# Patient Record
Sex: Female | Born: 1945 | Race: White | Hispanic: No | Marital: Married | Smoking: Former smoker
Health system: Southern US, Community
[De-identification: ages and names within clinical notes are randomized; demographics above are authoritative.]

## PROBLEM LIST (undated history)

## (undated) DIAGNOSIS — F32A Depression, unspecified: Secondary | ICD-10-CM

## (undated) DIAGNOSIS — E049 Nontoxic goiter, unspecified: Secondary | ICD-10-CM

## (undated) DIAGNOSIS — R030 Elevated blood-pressure reading, without diagnosis of hypertension: Secondary | ICD-10-CM

## (undated) DIAGNOSIS — R053 Chronic cough: Secondary | ICD-10-CM

## (undated) DIAGNOSIS — F329 Major depressive disorder, single episode, unspecified: Secondary | ICD-10-CM

## (undated) DIAGNOSIS — I10 Essential (primary) hypertension: Secondary | ICD-10-CM

## (undated) DIAGNOSIS — F419 Anxiety disorder, unspecified: Secondary | ICD-10-CM

## (undated) DIAGNOSIS — R05 Cough: Secondary | ICD-10-CM

## (undated) DIAGNOSIS — Z780 Asymptomatic menopausal state: Secondary | ICD-10-CM

## (undated) DIAGNOSIS — M5431 Sciatica, right side: Secondary | ICD-10-CM

## (undated) DIAGNOSIS — E785 Hyperlipidemia, unspecified: Secondary | ICD-10-CM

## (undated) DIAGNOSIS — K219 Gastro-esophageal reflux disease without esophagitis: Secondary | ICD-10-CM

## (undated) DIAGNOSIS — R062 Wheezing: Secondary | ICD-10-CM

## (undated) HISTORY — DX: Asymptomatic menopausal state: Z78.0

## (undated) HISTORY — DX: Gastro-esophageal reflux disease without esophagitis: K21.9

## (undated) HISTORY — DX: Hyperlipidemia, unspecified: E78.5

## (undated) HISTORY — PX: KNEE SURGERY: SHX244

## (undated) HISTORY — DX: Essential (primary) hypertension: I10

## (undated) HISTORY — DX: Depression, unspecified: F32.A

## (undated) HISTORY — DX: Anxiety disorder, unspecified: F41.9

## (undated) HISTORY — PX: COLONOSCOPY: SHX174

## (undated) HISTORY — DX: Nontoxic goiter, unspecified: E04.9

## (undated) HISTORY — PX: TONSILLECTOMY: SUR1361

## (undated) HISTORY — PX: CATARACT EXTRACTION: SUR2

## (undated) HISTORY — DX: Major depressive disorder, single episode, unspecified: F32.9

## (undated) HISTORY — DX: Chronic cough: R05.3

## (undated) HISTORY — DX: Cough: R05

## (undated) HISTORY — DX: Elevated blood-pressure reading, without diagnosis of hypertension: R03.0

## (undated) HISTORY — DX: Wheezing: R06.2

## (undated) HISTORY — PX: BRONCHOSCOPY: SUR163

## (undated) HISTORY — DX: Sciatica, right side: M54.31

## (undated) HISTORY — PX: PARTIAL HYSTERECTOMY: SHX80

## (undated) HISTORY — PX: OTHER SURGICAL HISTORY: SHX169

## (undated) HISTORY — PX: POLYPECTOMY: SHX149

---

## 2001-07-17 ENCOUNTER — Encounter: Payer: Self-pay | Admitting: Allergy and Immunology

## 2001-07-17 ENCOUNTER — Encounter: Admission: RE | Admit: 2001-07-17 | Discharge: 2001-07-17 | Payer: Self-pay | Admitting: *Deleted

## 2003-03-18 ENCOUNTER — Encounter: Payer: Self-pay | Admitting: Internal Medicine

## 2003-03-18 ENCOUNTER — Encounter: Admission: RE | Admit: 2003-03-18 | Discharge: 2003-03-18 | Payer: Self-pay | Admitting: Internal Medicine

## 2004-08-14 ENCOUNTER — Ambulatory Visit: Payer: Self-pay | Admitting: Internal Medicine

## 2004-08-20 ENCOUNTER — Encounter: Admission: RE | Admit: 2004-08-20 | Discharge: 2004-08-20 | Payer: Self-pay | Admitting: Internal Medicine

## 2005-03-25 ENCOUNTER — Ambulatory Visit: Payer: Self-pay | Admitting: Internal Medicine

## 2005-04-02 ENCOUNTER — Ambulatory Visit: Payer: Self-pay

## 2005-04-04 ENCOUNTER — Ambulatory Visit: Payer: Self-pay | Admitting: Internal Medicine

## 2005-04-10 ENCOUNTER — Ambulatory Visit: Payer: Self-pay | Admitting: Internal Medicine

## 2005-04-30 ENCOUNTER — Ambulatory Visit: Payer: Self-pay | Admitting: Internal Medicine

## 2005-05-10 ENCOUNTER — Encounter: Admission: RE | Admit: 2005-05-10 | Discharge: 2005-08-08 | Payer: Self-pay | Admitting: Internal Medicine

## 2005-07-12 ENCOUNTER — Ambulatory Visit: Payer: Self-pay | Admitting: Internal Medicine

## 2005-09-06 ENCOUNTER — Ambulatory Visit: Payer: Self-pay | Admitting: Internal Medicine

## 2005-11-25 ENCOUNTER — Encounter: Admission: RE | Admit: 2005-11-25 | Discharge: 2005-11-25 | Payer: Self-pay | Admitting: Internal Medicine

## 2006-01-08 ENCOUNTER — Ambulatory Visit: Payer: Self-pay | Admitting: Internal Medicine

## 2006-02-18 ENCOUNTER — Ambulatory Visit: Payer: Self-pay | Admitting: Endocrinology

## 2006-02-18 ENCOUNTER — Encounter: Payer: Self-pay | Admitting: Internal Medicine

## 2006-02-24 ENCOUNTER — Ambulatory Visit (HOSPITAL_COMMUNITY): Admission: RE | Admit: 2006-02-24 | Discharge: 2006-02-24 | Payer: Self-pay | Admitting: Endocrinology

## 2006-02-24 HISTORY — PX: OTHER SURGICAL HISTORY: SHX169

## 2006-04-23 ENCOUNTER — Ambulatory Visit: Payer: Self-pay | Admitting: Internal Medicine

## 2006-07-28 ENCOUNTER — Ambulatory Visit: Payer: Self-pay | Admitting: Internal Medicine

## 2006-07-31 ENCOUNTER — Ambulatory Visit: Payer: Self-pay | Admitting: Internal Medicine

## 2006-11-27 ENCOUNTER — Encounter: Admission: RE | Admit: 2006-11-27 | Discharge: 2006-11-27 | Payer: Self-pay | Admitting: Internal Medicine

## 2007-01-12 ENCOUNTER — Ambulatory Visit: Payer: Self-pay | Admitting: Gastroenterology

## 2007-01-12 ENCOUNTER — Ambulatory Visit: Payer: Self-pay | Admitting: Internal Medicine

## 2007-01-12 DIAGNOSIS — F419 Anxiety disorder, unspecified: Secondary | ICD-10-CM

## 2007-01-12 DIAGNOSIS — J45909 Unspecified asthma, uncomplicated: Secondary | ICD-10-CM | POA: Insufficient documentation

## 2007-01-12 DIAGNOSIS — F32A Depression, unspecified: Secondary | ICD-10-CM | POA: Insufficient documentation

## 2007-01-12 DIAGNOSIS — K219 Gastro-esophageal reflux disease without esophagitis: Secondary | ICD-10-CM | POA: Insufficient documentation

## 2007-01-12 DIAGNOSIS — F329 Major depressive disorder, single episode, unspecified: Secondary | ICD-10-CM

## 2007-01-12 LAB — CONVERTED CEMR LAB
AST: 26 units/L (ref 0–37)
Calcium: 9.3 mg/dL (ref 8.4–10.5)
Eosinophils Absolute: 0.1 10*3/uL (ref 0.0–0.6)
Eosinophils Relative: 1.8 % (ref 0.0–5.0)
Free T4: 0.6 ng/dL (ref 0.6–1.6)
GFR calc Af Amer: 93 mL/min
GFR calc non Af Amer: 77 mL/min
Glucose, Bld: 107 mg/dL — ABNORMAL HIGH (ref 70–99)
HDL: 49.7 mg/dL (ref >39.0)
Hemoglobin: 13.9 g/dL (ref 12.0–15.0)
Lymphocytes Relative: 28.8 % (ref 12.0–46.0)
MCV: 91.5 fL (ref 78.0–100.0)
Microalb Creat Ratio: 8.9 mg/g (ref 0.0–30.0)
Monocytes Absolute: 0.4 10*3/uL (ref 0.2–0.7)
Monocytes Relative: 6 % (ref 3.0–11.0)
Neutro Abs: 3.9 10*3/uL (ref 1.4–7.7)
Platelets: 264 10*3/uL (ref 150–400)
Potassium: 3.9 meq/L (ref 3.5–5.1)
T3, Free: 3.2 pg/mL (ref 2.3–4.2)
TSH: 0.43 microintl units/mL (ref 0.35–5.50)
Triglycerides: 139 mg/dL (ref 0–149)
WBC: 6.2 10*3/uL (ref 4.5–10.5)

## 2007-01-19 ENCOUNTER — Ambulatory Visit: Payer: Self-pay | Admitting: Internal Medicine

## 2007-01-30 ENCOUNTER — Ambulatory Visit: Payer: Self-pay | Admitting: Gastroenterology

## 2007-01-30 ENCOUNTER — Encounter (INDEPENDENT_AMBULATORY_CARE_PROVIDER_SITE_OTHER): Payer: Self-pay | Admitting: Specialist

## 2007-01-30 LAB — HM COLONOSCOPY

## 2007-02-03 ENCOUNTER — Encounter: Admission: RE | Admit: 2007-02-03 | Discharge: 2007-02-03 | Payer: Self-pay | Admitting: General Surgery

## 2007-02-12 ENCOUNTER — Ambulatory Visit: Payer: Self-pay | Admitting: Internal Medicine

## 2007-04-13 ENCOUNTER — Telehealth: Payer: Self-pay | Admitting: Internal Medicine

## 2007-04-29 ENCOUNTER — Ambulatory Visit: Payer: Self-pay | Admitting: Internal Medicine

## 2007-05-07 ENCOUNTER — Encounter (INDEPENDENT_AMBULATORY_CARE_PROVIDER_SITE_OTHER): Payer: Self-pay | Admitting: *Deleted

## 2007-05-13 ENCOUNTER — Ambulatory Visit: Payer: Self-pay | Admitting: Internal Medicine

## 2007-05-29 ENCOUNTER — Ambulatory Visit: Payer: Self-pay | Admitting: Internal Medicine

## 2007-06-10 ENCOUNTER — Ambulatory Visit: Payer: Self-pay | Admitting: Internal Medicine

## 2007-07-11 ENCOUNTER — Encounter: Payer: Self-pay | Admitting: Endocrinology

## 2007-07-11 DIAGNOSIS — E042 Nontoxic multinodular goiter: Secondary | ICD-10-CM | POA: Insufficient documentation

## 2007-07-28 ENCOUNTER — Ambulatory Visit: Payer: Self-pay | Admitting: Internal Medicine

## 2007-08-05 ENCOUNTER — Telehealth (INDEPENDENT_AMBULATORY_CARE_PROVIDER_SITE_OTHER): Payer: Self-pay | Admitting: *Deleted

## 2007-08-19 ENCOUNTER — Ambulatory Visit: Payer: Self-pay | Admitting: Internal Medicine

## 2007-08-19 LAB — CONVERTED CEMR LAB: Glucose, Bld: 118 mg/dL

## 2007-08-21 ENCOUNTER — Ambulatory Visit: Payer: Self-pay | Admitting: Internal Medicine

## 2007-08-26 ENCOUNTER — Ambulatory Visit: Payer: Self-pay | Admitting: Family Medicine

## 2007-10-19 ENCOUNTER — Telehealth (INDEPENDENT_AMBULATORY_CARE_PROVIDER_SITE_OTHER): Payer: Self-pay | Admitting: *Deleted

## 2007-12-01 ENCOUNTER — Ambulatory Visit: Payer: Self-pay | Admitting: Internal Medicine

## 2007-12-16 ENCOUNTER — Encounter: Admission: RE | Admit: 2007-12-16 | Discharge: 2007-12-16 | Payer: Self-pay | Admitting: Internal Medicine

## 2008-01-21 ENCOUNTER — Telehealth (INDEPENDENT_AMBULATORY_CARE_PROVIDER_SITE_OTHER): Payer: Self-pay | Admitting: *Deleted

## 2008-02-19 ENCOUNTER — Ambulatory Visit: Payer: Self-pay | Admitting: Internal Medicine

## 2008-02-19 DIAGNOSIS — E785 Hyperlipidemia, unspecified: Secondary | ICD-10-CM | POA: Insufficient documentation

## 2008-02-22 ENCOUNTER — Encounter (INDEPENDENT_AMBULATORY_CARE_PROVIDER_SITE_OTHER): Payer: Self-pay | Admitting: *Deleted

## 2008-02-22 LAB — CONVERTED CEMR LAB
BUN: 14 mg/dL (ref 6–23)
Chloride: 103 meq/L (ref 96–112)
Cholesterol: 191 mg/dL (ref 0–200)
Direct LDL: 103.3 mg/dL
Eosinophils Absolute: 0.1 10*3/uL (ref 0.0–0.7)
Eosinophils Relative: 2.5 % (ref 0.0–5.0)
GFR calc non Af Amer: 77 mL/min
Glucose, Bld: 114 mg/dL — ABNORMAL HIGH (ref 70–99)
Hemoglobin: 14.9 g/dL (ref 12.0–15.0)
Hgb A1c MFr Bld: 6.7 % — ABNORMAL HIGH (ref 4.6–6.0)
Lymphocytes Relative: 32 % (ref 12.0–46.0)
Microalb Creat Ratio: 26.6 mg/g (ref 0.0–30.0)
Monocytes Relative: 7.8 % (ref 3.0–12.0)
Neutrophils Relative %: 56.6 % (ref 43.0–77.0)
Platelets: 241 10*3/uL (ref 150–400)
Potassium: 4.3 meq/L (ref 3.5–5.1)
RBC: 4.7 M/uL (ref 3.87–5.11)

## 2008-02-24 ENCOUNTER — Encounter (INDEPENDENT_AMBULATORY_CARE_PROVIDER_SITE_OTHER): Payer: Self-pay | Admitting: *Deleted

## 2008-03-08 ENCOUNTER — Encounter: Payer: Self-pay | Admitting: Internal Medicine

## 2008-04-14 ENCOUNTER — Telehealth (INDEPENDENT_AMBULATORY_CARE_PROVIDER_SITE_OTHER): Payer: Self-pay | Admitting: *Deleted

## 2008-06-27 ENCOUNTER — Ambulatory Visit: Payer: Self-pay | Admitting: Internal Medicine

## 2008-07-01 ENCOUNTER — Telehealth: Payer: Self-pay | Admitting: Internal Medicine

## 2008-07-07 ENCOUNTER — Telehealth: Payer: Self-pay | Admitting: Internal Medicine

## 2008-07-08 ENCOUNTER — Ambulatory Visit: Payer: Self-pay | Admitting: Internal Medicine

## 2008-09-12 ENCOUNTER — Telehealth: Payer: Self-pay | Admitting: Internal Medicine

## 2008-11-16 ENCOUNTER — Telehealth (INDEPENDENT_AMBULATORY_CARE_PROVIDER_SITE_OTHER): Payer: Self-pay | Admitting: *Deleted

## 2008-11-30 ENCOUNTER — Encounter: Payer: Self-pay | Admitting: Internal Medicine

## 2008-12-15 ENCOUNTER — Encounter (INDEPENDENT_AMBULATORY_CARE_PROVIDER_SITE_OTHER): Payer: Self-pay | Admitting: *Deleted

## 2009-01-12 ENCOUNTER — Encounter: Admission: RE | Admit: 2009-01-12 | Discharge: 2009-01-12 | Payer: Self-pay | Admitting: Internal Medicine

## 2009-01-12 LAB — HM MAMMOGRAPHY: HM Mammogram: NEGATIVE

## 2009-01-16 ENCOUNTER — Encounter (INDEPENDENT_AMBULATORY_CARE_PROVIDER_SITE_OTHER): Payer: Self-pay | Admitting: *Deleted

## 2009-02-14 ENCOUNTER — Ambulatory Visit: Payer: Self-pay | Admitting: Internal Medicine

## 2009-02-14 DIAGNOSIS — G4700 Insomnia, unspecified: Secondary | ICD-10-CM | POA: Insufficient documentation

## 2009-02-15 ENCOUNTER — Ambulatory Visit: Payer: Self-pay | Admitting: Internal Medicine

## 2009-02-21 LAB — CONVERTED CEMR LAB
Basophils Relative: 0.2 % (ref 0.0–3.0)
Chloride: 108 meq/L (ref 96–112)
Cholesterol: 140 mg/dL (ref 0–200)
Eosinophils Absolute: 0.1 10*3/uL (ref 0.0–0.7)
LDL Cholesterol: 71 mg/dL (ref 0–99)
MCHC: 33.4 g/dL (ref 30.0–36.0)
MCV: 93.3 fL (ref 78.0–100.0)
Monocytes Absolute: 0.3 10*3/uL (ref 0.1–1.0)
Neutro Abs: 3.3 10*3/uL (ref 1.4–7.7)
Neutrophils Relative %: 63.6 % (ref 43.0–77.0)
Potassium: 4.7 meq/L (ref 3.5–5.1)
RBC: 4.29 M/uL (ref 3.87–5.11)
Sodium: 145 meq/L (ref 135–145)
TSH: 0.32 microintl units/mL — ABNORMAL LOW (ref 0.35–5.50)
Total CHOL/HDL Ratio: 3
Vit D, 25-Hydroxy: 14 ng/mL — ABNORMAL LOW (ref 30–89)

## 2009-02-22 ENCOUNTER — Ambulatory Visit: Payer: Self-pay | Admitting: Internal Medicine

## 2009-02-28 ENCOUNTER — Encounter: Payer: Self-pay | Admitting: Internal Medicine

## 2009-02-28 ENCOUNTER — Encounter: Admission: RE | Admit: 2009-02-28 | Discharge: 2009-02-28 | Payer: Self-pay | Admitting: Internal Medicine

## 2009-03-01 ENCOUNTER — Telehealth (INDEPENDENT_AMBULATORY_CARE_PROVIDER_SITE_OTHER): Payer: Self-pay | Admitting: *Deleted

## 2009-03-01 LAB — CONVERTED CEMR LAB
Free T4: 0.6 ng/dL (ref 0.6–1.6)
T3, Free: 2.9 pg/mL (ref 2.3–4.2)

## 2009-03-06 ENCOUNTER — Telehealth (INDEPENDENT_AMBULATORY_CARE_PROVIDER_SITE_OTHER): Payer: Self-pay | Admitting: *Deleted

## 2009-03-31 ENCOUNTER — Telehealth (INDEPENDENT_AMBULATORY_CARE_PROVIDER_SITE_OTHER): Payer: Self-pay | Admitting: *Deleted

## 2009-04-24 IMAGING — CR DG CHEST 2V
2 series · 2 of 2 positions shown · non-contrast
Comparison: Chest of 10/27/90.

CLINICAL DATA: Cough. 
 CHEST ? 2 VIEW:

[view not recorded (1 of 2)]
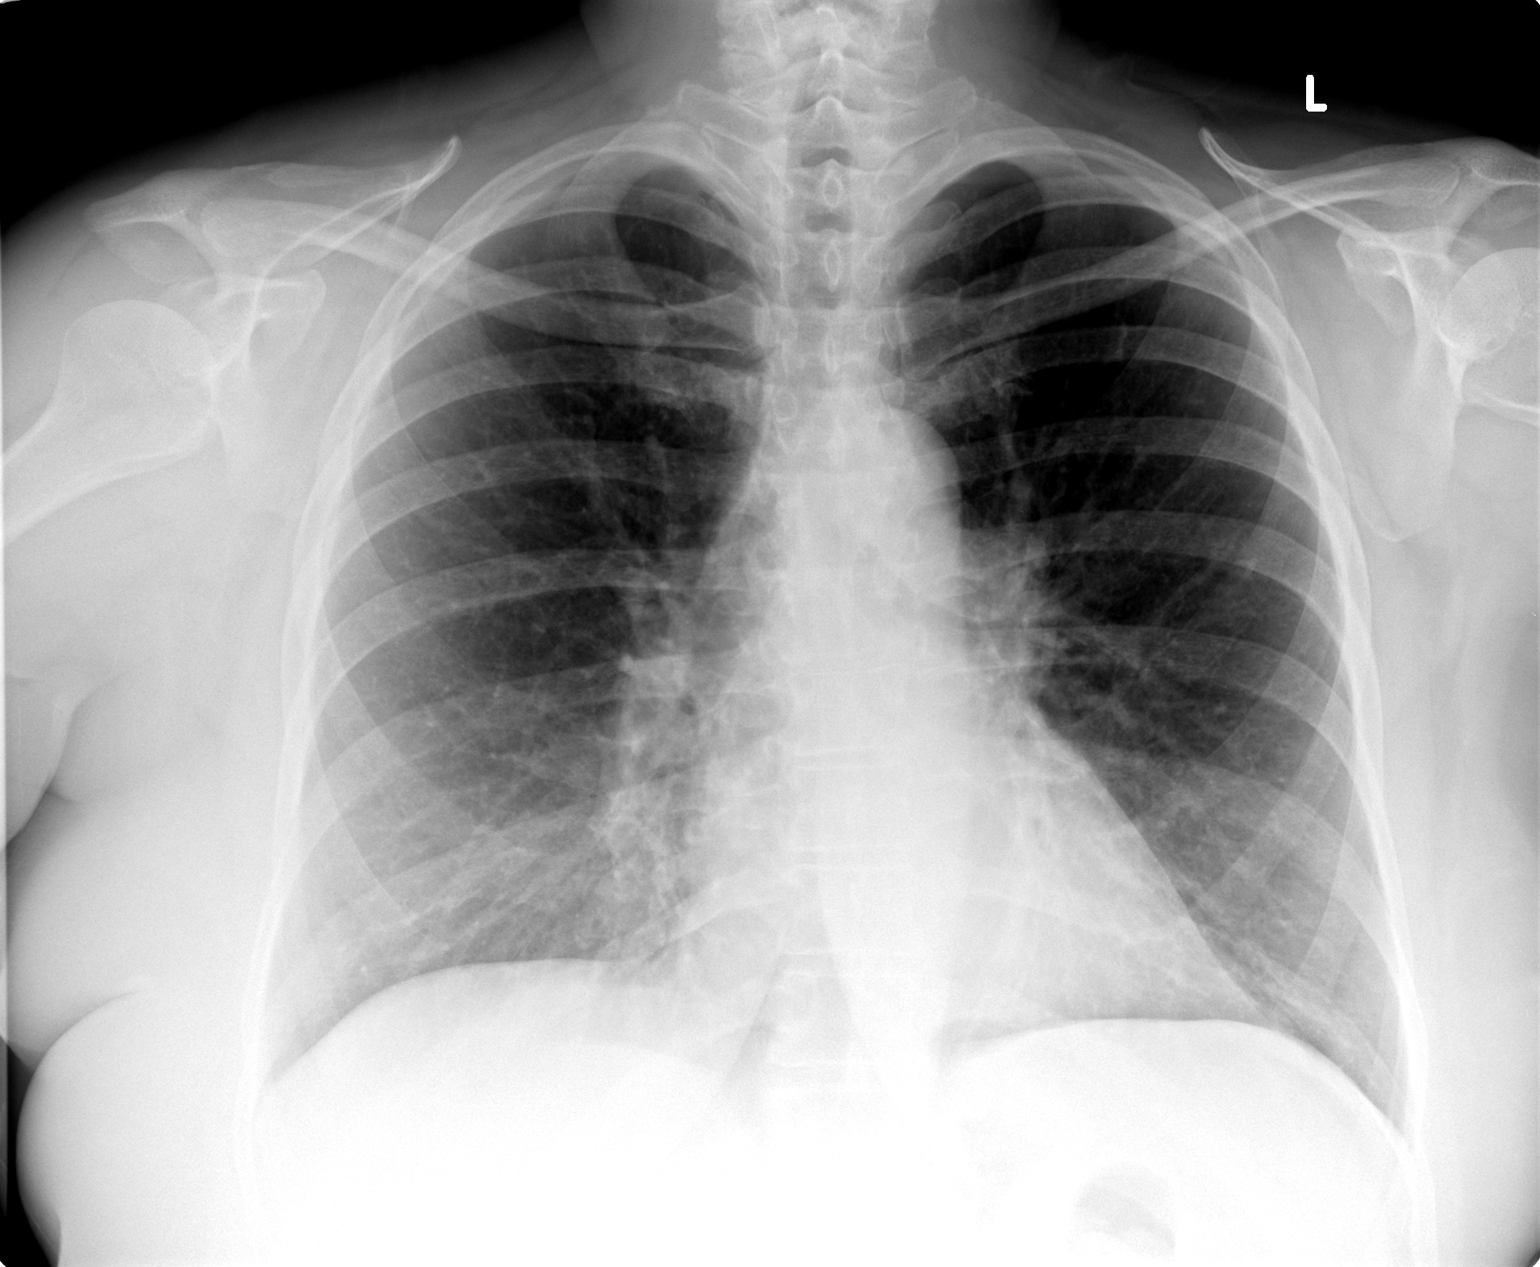

[view not recorded (2 of 2)]
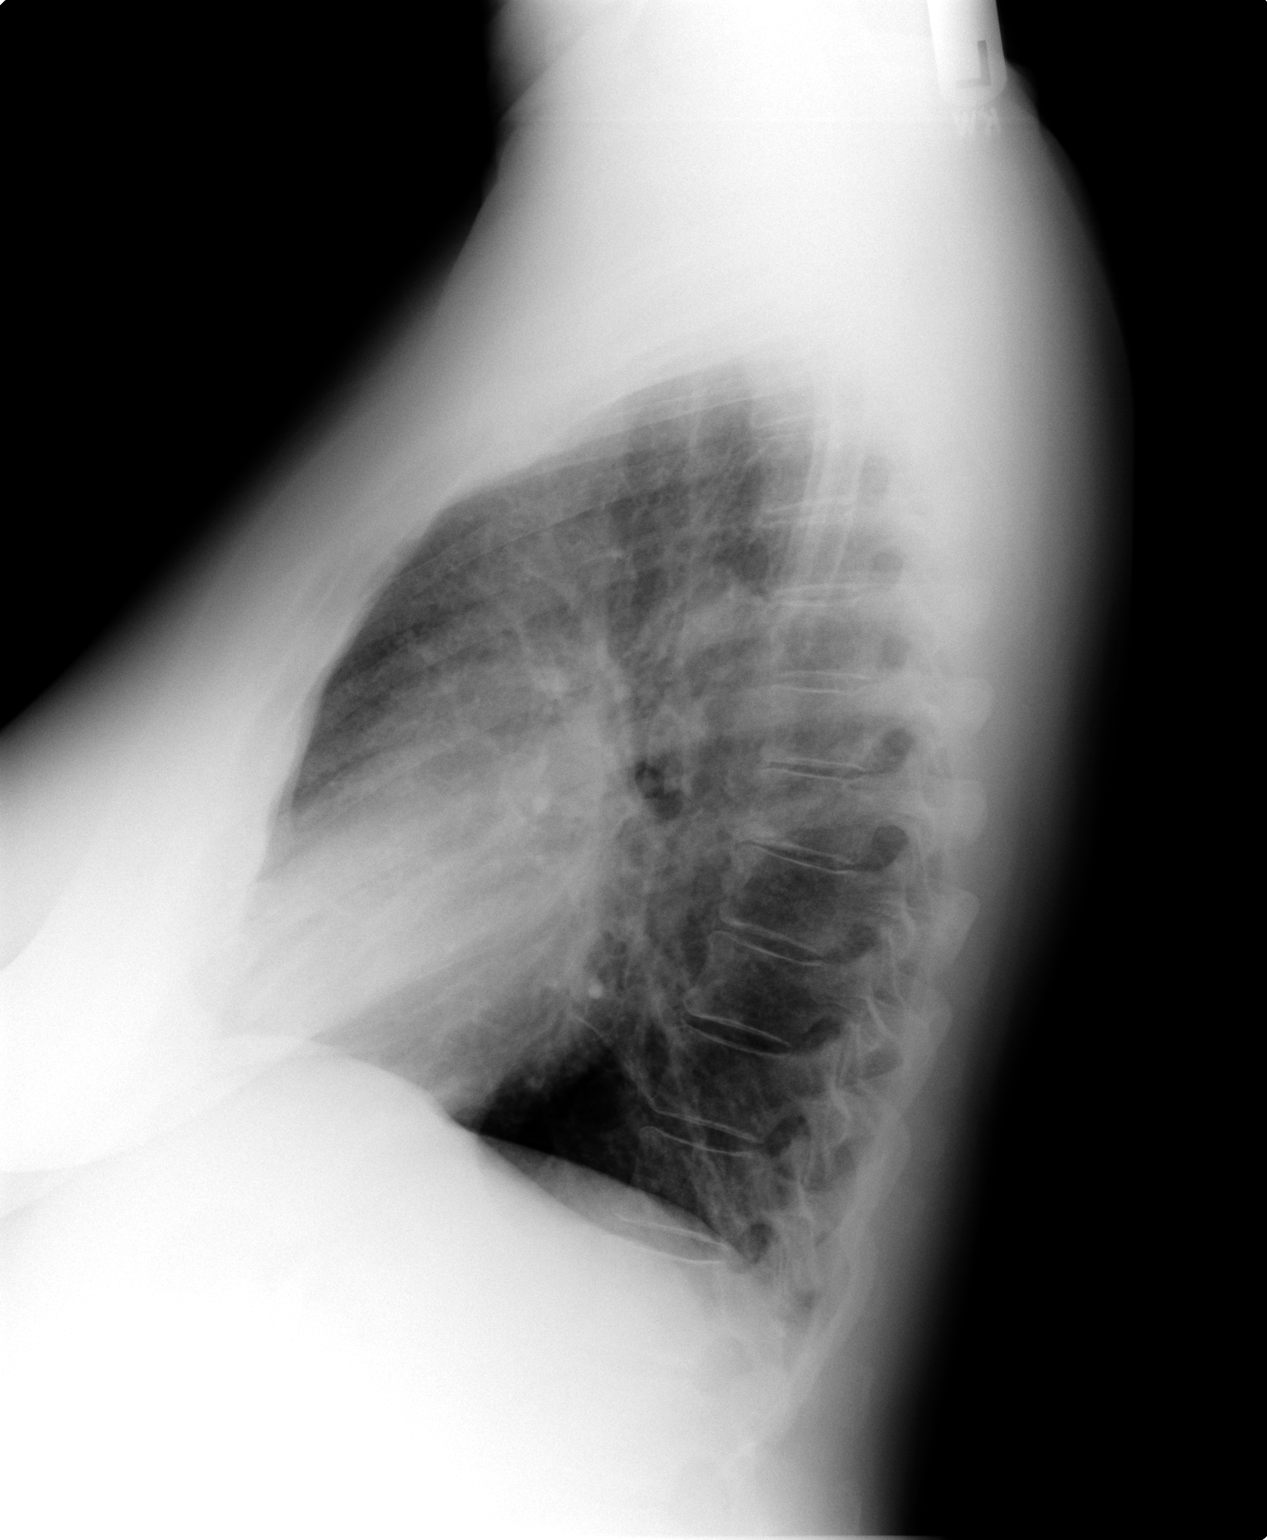

[2 of 2 positions shown; findings below may reference images not displayed]

FINDINGS: Two views of the chest show no active infiltrate or effusion.  Mild peribronchial thickening is noted.  The heart is within normal limits in size.
IMPRESSION: 1. No active lung disease. 
 2. Mild peribronchial thickening.

## 2009-05-18 ENCOUNTER — Telehealth: Payer: Self-pay | Admitting: Internal Medicine

## 2009-05-18 ENCOUNTER — Telehealth (INDEPENDENT_AMBULATORY_CARE_PROVIDER_SITE_OTHER): Payer: Self-pay | Admitting: *Deleted

## 2009-05-22 ENCOUNTER — Telehealth (INDEPENDENT_AMBULATORY_CARE_PROVIDER_SITE_OTHER): Payer: Self-pay | Admitting: *Deleted

## 2009-05-22 ENCOUNTER — Encounter: Payer: Self-pay | Admitting: Internal Medicine

## 2009-07-11 ENCOUNTER — Encounter: Payer: Self-pay | Admitting: Internal Medicine

## 2009-07-27 ENCOUNTER — Ambulatory Visit: Payer: Self-pay | Admitting: Internal Medicine

## 2009-07-28 ENCOUNTER — Telehealth: Payer: Self-pay | Admitting: Internal Medicine

## 2009-09-08 ENCOUNTER — Encounter (INDEPENDENT_AMBULATORY_CARE_PROVIDER_SITE_OTHER): Payer: Self-pay | Admitting: *Deleted

## 2009-09-14 ENCOUNTER — Ambulatory Visit: Payer: Self-pay | Admitting: Internal Medicine

## 2009-09-14 ENCOUNTER — Observation Stay (HOSPITAL_COMMUNITY): Admission: AD | Admit: 2009-09-14 | Discharge: 2009-09-15 | Payer: Self-pay | Admitting: Geriatric Medicine

## 2009-09-14 DIAGNOSIS — J479 Bronchiectasis, uncomplicated: Secondary | ICD-10-CM | POA: Insufficient documentation

## 2009-09-15 ENCOUNTER — Ambulatory Visit: Payer: Self-pay | Admitting: Vascular Surgery

## 2009-09-15 ENCOUNTER — Encounter: Payer: Self-pay | Admitting: Internal Medicine

## 2009-09-21 ENCOUNTER — Telehealth (INDEPENDENT_AMBULATORY_CARE_PROVIDER_SITE_OTHER): Payer: Self-pay | Admitting: *Deleted

## 2009-10-18 ENCOUNTER — Ambulatory Visit: Payer: Self-pay | Admitting: Internal Medicine

## 2009-10-18 LAB — CONVERTED CEMR LAB: TSH: 0.26 microintl units/mL — ABNORMAL LOW (ref 0.35–5.50)

## 2009-11-06 ENCOUNTER — Telehealth: Payer: Self-pay | Admitting: Internal Medicine

## 2009-11-07 ENCOUNTER — Telehealth (INDEPENDENT_AMBULATORY_CARE_PROVIDER_SITE_OTHER): Payer: Self-pay | Admitting: *Deleted

## 2010-01-08 ENCOUNTER — Encounter (INDEPENDENT_AMBULATORY_CARE_PROVIDER_SITE_OTHER): Payer: Self-pay | Admitting: *Deleted

## 2010-02-06 ENCOUNTER — Ambulatory Visit: Payer: Self-pay | Admitting: Internal Medicine

## 2010-02-06 DIAGNOSIS — E119 Type 2 diabetes mellitus without complications: Secondary | ICD-10-CM | POA: Insufficient documentation

## 2010-02-06 DIAGNOSIS — M949 Disorder of cartilage, unspecified: Secondary | ICD-10-CM

## 2010-02-06 DIAGNOSIS — M899 Disorder of bone, unspecified: Secondary | ICD-10-CM | POA: Insufficient documentation

## 2010-02-06 DIAGNOSIS — E079 Disorder of thyroid, unspecified: Secondary | ICD-10-CM | POA: Insufficient documentation

## 2010-02-07 ENCOUNTER — Telehealth: Payer: Self-pay | Admitting: Internal Medicine

## 2010-02-08 ENCOUNTER — Ambulatory Visit: Payer: Self-pay | Admitting: Internal Medicine

## 2010-02-08 DIAGNOSIS — E0590 Thyrotoxicosis, unspecified without thyrotoxic crisis or storm: Secondary | ICD-10-CM | POA: Insufficient documentation

## 2010-02-09 ENCOUNTER — Ambulatory Visit: Payer: Self-pay | Admitting: Internal Medicine

## 2010-02-09 LAB — CONVERTED CEMR LAB
Basophils Absolute: 0 10*3/uL (ref 0.0–0.1)
Calcium: 9.3 mg/dL (ref 8.4–10.5)
Creatinine, Ser: 0.7 mg/dL (ref 0.4–1.2)
Creatinine,U: 85.7 mg/dL
Eosinophils Relative: 0 % (ref 0.0–5.0)
Free T4: 0.7 ng/dL (ref 0.6–1.6)
GFR calc non Af Amer: 83.57 mL/min (ref >60)
Glucose, Bld: 99 mg/dL (ref 70–99)
HCT: 38.1 % (ref 36.0–46.0)
Hgb A1c MFr Bld: 6.6 % — ABNORMAL HIGH (ref 4.6–6.5)
Lymphs Abs: 1.6 10*3/uL (ref 0.7–4.0)
MCV: 93.3 fL (ref 78.0–100.0)
Monocytes Absolute: 0.4 10*3/uL (ref 0.1–1.0)
Neutro Abs: 5.5 10*3/uL (ref 1.4–7.7)
Platelets: 264 10*3/uL (ref 150.0–400.0)
RDW: 14.9 % — ABNORMAL HIGH (ref 11.5–14.6)
Sodium: 144 meq/L (ref 135–145)
T3, Free: 2.2 pg/mL — ABNORMAL LOW (ref 2.3–4.2)
Total CHOL/HDL Ratio: 2
VLDL: 16.2 mg/dL (ref 0.0–40.0)

## 2010-02-12 ENCOUNTER — Ambulatory Visit: Payer: Self-pay | Admitting: Internal Medicine

## 2010-02-13 ENCOUNTER — Telehealth: Payer: Self-pay | Admitting: Internal Medicine

## 2010-04-27 ENCOUNTER — Ambulatory Visit: Payer: Self-pay | Admitting: Family Medicine

## 2010-06-26 ENCOUNTER — Encounter: Payer: Self-pay | Admitting: Internal Medicine

## 2010-07-30 ENCOUNTER — Telehealth: Payer: Self-pay | Admitting: Internal Medicine

## 2010-08-24 ENCOUNTER — Ambulatory Visit: Payer: Self-pay | Admitting: Internal Medicine

## 2010-08-24 DIAGNOSIS — M6740 Ganglion, unspecified site: Secondary | ICD-10-CM | POA: Insufficient documentation

## 2010-08-24 DIAGNOSIS — I10 Essential (primary) hypertension: Secondary | ICD-10-CM | POA: Insufficient documentation

## 2010-08-29 LAB — CONVERTED CEMR LAB
BUN: 18 mg/dL (ref 6–23)
CO2: 28 meq/L (ref 19–32)
Chloride: 103 meq/L (ref 96–112)
Glucose, Bld: 113 mg/dL — ABNORMAL HIGH (ref 70–99)
Potassium: 4.7 meq/L (ref 3.5–5.3)
Sodium: 142 meq/L (ref 135–145)
TSH: 0.728 microintl units/mL (ref 0.350–4.500)

## 2010-09-03 ENCOUNTER — Telehealth: Payer: Self-pay | Admitting: Internal Medicine

## 2010-10-23 ENCOUNTER — Encounter: Payer: Self-pay | Admitting: Internal Medicine

## 2010-10-25 NOTE — Progress Notes (Signed)
Summary: colon due 01/2012  ---- Converted from flag ---- ---- 02/06/2010 6:37 PM, Raheen Capili E. Lash Matulich MD wrote: contact GI ([facility]), next Cscope??) ------------------------------ due 01/2012 .Shary Decamp  Feb 07, 2010 8:27 AM

## 2010-10-25 NOTE — Letter (Signed)
Summary: Primary Care Appointment Letter  [facility]  [address] [place], [place] [address]   Phone: [phone]  Fax: [phone]    01/08/2010 MRN: [id number]  [facility] [address] [place], [place]  [address]  Dear [facility],   Your Primary Care Physician Jose E. Paz MD has indicated that:    ____X___it is time to schedule an appointment.  Please call our office @ [phone] to schedule a complete physical exam with Dr. Drue Novel.  Thank you,    [facility] Scheduler

## 2010-10-25 NOTE — Letter (Signed)
Summary: [facility]  [facility]   Imported By: Lanelle Bal 07/26/2010 15:36:07  _____________________________________________________________________  External Attachment:    Type:   Image     Comment:   External Document

## 2010-10-25 NOTE — Progress Notes (Signed)
Summary: right leg pain, referral to ortho  Phone Note Call from Patient   Caller: Patient Summary of Call: Still with right leg pain from falling 08/2009.  referral to ortho Shary Decamp  November 07, 2009 4:18 PM   New Problems: LEG PAIN, RIGHT (ICD-729.5)   New Problems: LEG PAIN, RIGHT (ICD-729.5)

## 2010-10-25 NOTE — Progress Notes (Signed)
Summary: laboratory results  Phone Note Outgoing Call   Summary of Call: advised patient -repeated potassium was normal -vitamin D continue to be low, ergocalciferol 50,000 units weekly for 3 months again.  -diabetes well controlled -cholesterol very well controlled, continue with the same medication -TSH continue to be slightly suppressed, recheck in 6 months Jose E. Paz MD  Feb 13, 2010 5:31 PM     New/Updated Medications: ERGOCALCIFEROL 50000 UNIT CAPS (ERGOCALCIFEROL) 1 by mouth every week x 12 weeks Prescriptions: ERGOCALCIFEROL 50000 UNIT CAPS (ERGOCALCIFEROL) 1 by mouth every week x 12 weeks  #12 x 0   Entered by:   Shary Decamp   Authorized by:   Nolon Rod. Paz MD   Signed by:   Shary Decamp on 02/14/2010   Method used:   Electronically to        [facility] [id number] (retail)       [address]       [place], [place]  [address]       Ph: [phone] or [phone]       Fax: [phone]   RxID:   [id number]

## 2010-10-25 NOTE — Progress Notes (Signed)
Summary: refill  Phone Note Refill Request Message from:  Fax from Pharmacy on July 30, 2010 11:46 AM  Refills Requested: Medication #1:  AMBIEN CR 12.5 MG CR-TABS 1 by mouth at bedtime [BMN] [facility] [phone] - 90 DAY SUPPLY  Initial call taken by: Okey Regal [place],  July 30, 2010 11:51 AM  Follow-up for Phone Call        last filled 02/06/10 Follow-up by: Army Fossa CMA,  July 30, 2010 11:52 AM  Additional Follow-up for Phone Call Additional follow up Details #1::        ok #90 and 1 RF Jose E. Paz MD  July 30, 2010 1:41 PM     Prescriptions: AMBIEN CR 12.5 MG CR-TABS (ZOLPIDEM TARTRATE) 1 by mouth at bedtime Brand medically necessary #90 x 1   Entered by:   Army Fossa CMA   Authorized by:   Nolon Rod. Paz MD   Signed by:   Army Fossa CMA on 07/30/2010   Method used:   Printed then faxed to ...       [facility] (mail-order)             , [place]         Ph:        Fax: [phone]   RxID:   [id number]

## 2010-10-25 NOTE — Assessment & Plan Note (Signed)
Summary: HAVING TROUBLE WITH FEET/FLU SHOT/KN   Vital Signs:  Patient profile:   65 year old female Weight:      210.50 pounds Pulse rate:   87 / minute Pulse rhythm:   regular BP sitting:   148 / 60  (left arm) Cuff size:   regular  Vitals Entered By: Army Fossa CMA (August 24, 2010 3:43 PM) CC: Pt here c/o pain in both feet. Comments Hurt to walk. Swollen x Several months [facility] flu shot    History of Present Illness: here today because she has pain and a lump in the dorsal aspect of her feet. L>R the lump has been there for a while      Current Medications (verified): 1)  Flonase 50 Mcg/act Susp (Fluticasone Propionate) .... Spray 1 Spray Into Both Nostrils Twice A Day 2)  Ventolin Hfa 108 (90 Base) Mcg/act  Aers (Albuterol Sulfate) .... 2 Puff Every 4 Hours As Needed 3)  Singulair 10 Mg Tabs (Montelukast Sodium) .... Take 1 Tablet By Mouth At Bedtime 4)  Tussionex Pennkinetic Er 8-10 Mg/80ml Lqcr (Chlorpheniramine-Hydrocodone) .... Take 1 Teaspoon By Mouth Twice A Day 5)  Omeprazole 20 Mg Cpdr (Omeprazole) .... Bid 6)  Lipitor 80 Mg  Tabs (Atorvastatin Calcium) .Marland Kitchen.. 1 By Mouth Once Daily 7)  Verapamil Hcl Cr 120 Mg Tbcr (Verapamil Hcl) .... Take 1 Tablet By Mouth Once A Day 8)  Citalopram Hydrobromide 20 Mg Tabs (Citalopram Hydrobromide) .... Two Times A Day - Due Office Visit For Additional Refills 9)  Ambien Cr 12.5 Mg Cr-Tabs (Zolpidem Tartrate) .Marland Kitchen.. 1 By Mouth At Bedtime  Allergies (verified): No Known Drug Allergies  Past History:  Past Medical History: G0 P0  menopause Increased BP w/cardiolite DIABETES MELLITUS, TYPE II, BORDERLINE  DEPRESSION  ANXIETY   GERD   GOITER, MULTINODULAR   Hyperlipidemia ASTHMA (h/o IgG deficiency, bronchiectasis), sees Dr Dallie Dad , previously saw  Dr Sherene Sires   Hypertension  Past Surgical History: Reviewed history from 02/06/2010 and no changes required. Thyroid Ultrasound (02/24/2006) Hysterectomy,  partial Tonsillectomy had a Bronchoscopy in the past  eye  Lid surgery (cosmetic)  Social History: Reviewed history from 02/06/2010 and no changes required. moved from [place] aprox 2003  Married 2 kids -- adopted  Retired tobacco-- quit at age 59 ETOH-- rarely  Regular exercise--no  Review of Systems       good medication with her BP medicine amb. BP usually around 150 , at her allergies her systolic blood pressure was confirmed to be 150 Trace bilateral pretibial edema from time to time    Physical Exam  General:  alert and well-developed.   Mouth:  no gingival abnormalities.   Lungs:  normal respiratory effort and no intercostal retractions.  a few rhonchi and  wheezing bilaterally, no respiratory distress Heart:  normal rate, regular rhythm, and no murmur.   Pulses:  good pedal pulses bilaterally Extremities:  trace bilateral pretibial edema. At the dorsum of the feet, L>R , has a soft, nontender, no red mass : approximately 1.5 cm consistent with ganglion cysts Psych:  not anxious appearing and not depressed appearing.     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 Needs better control, currently on  verapamil   Recently, her potassium was slightly elevated (thus avoid ARBs-ACEi) for now we'll increase verapamil dose from 120 to ----> 180   Her updated medication list for this problem includes:    Verapamil Hcl Cr 180 Mg Cr-tabs (Verapamil hcl) .Marland Kitchen... 1 by mouth once daily  BP  today: 148/60 Prior BP: 130/70 (04/27/2010)  Labs Reviewed: K+: 4.3 (02/12/2010) Creat: : 0.7 (02/08/2010)   Chol: 144 (02/08/2010)   HDL: 61.40 (02/08/2010)   LDL: 66 (02/08/2010)   TG: 81.0 (02/08/2010)  Complete Medication List: 1)  Flonase 50 Mcg/act Susp (Fluticasone propionate) .... Spray 1 spray into both nostrils twice a day 2)  Ventolin Hfa 108 (90 Base) Mcg/act Aers (Albuterol sulfate) .... 2 puff every 4 hours as needed 3)  Singulair 10 Mg Tabs (Montelukast sodium) .... Take 1 tablet by mouth at bedtime 4)  Tussionex Pennkinetic Er 8-10 Mg/65ml Lqcr (Chlorpheniramine-hydrocodone) .... Take 1 teaspoon by mouth twice a day 5)  Omeprazole 20 Mg Cpdr (Omeprazole) .... Bid 6)  Lipitor 80 Mg Tabs (Atorvastatin calcium) .Marland Kitchen.. 1 by mouth once daily 7)  Verapamil Hcl Cr 180 Mg Cr-tabs (Verapamil hcl) .Marland Kitchen.. 1 by mouth once daily 8)  Citalopram Hydrobromide 20 Mg Tabs (Citalopram hydrobromide) .... Two times a day 9)  Ambien Cr 12.5 Mg Cr-tabs (Zolpidem tartrate) .Marland Kitchen.. 1 by mouth at bedtime  Other Orders: Admin 1st Vaccine (60454) Flu Vaccine 20yrs + (09811)  Patient Instructions: 1)  increase verapamil to 180 mg daily 2)  Check your blood pressure 2 or 3 times a week. If it is more than 140/85 consistently,please let us know  3)  Please schedule a follow-up appointment in 4 months .  Prescriptions: VERAPAMIL HCL CR 180 MG CR-TABS (VERAPAMIL HCL) 1 by mouth once daily  #90 x 1   Entered and Authorized by:   Elita Quick E. Lehua Flores MD   Signed by:   Nolon Rod. Kourtnee Lahey MD on 08/24/2010   Method used:   Print then Give to Patient   RxID:   [id number]    Orders Added: 1)  Admin 1st Vaccine [90471] 2)  Flu Vaccine 29yrs + [78469] 3)  Venipuncture [62952] 4)  Est. Patient Level III [84132] Flu Vaccine Consent Questions     Do you have a history of severe allergic reactions to this vaccine? no    Any prior history of allergic reactions to egg and/or gelatin? no    Do you have a sensitivity to the  preservative Thimersol? no    Do you have a past history of Guillan-Barre Syndrome? no    Do you currently have an acute febrile illness? no    Have you ever had a severe reaction to latex? no    Vaccine information given and explained to patient? yes    Are you currently pregnant? no    Lot Number:[id number]   Exp Date:03/23/2011   Site Given  Left Deltoid IMaccine 61yrs + [id number]     .[email or web address]

## 2010-10-25 NOTE — Progress Notes (Signed)
  Phone Note Outgoing Call   Summary of Call: patient has a history of low  TSH on-and-off for years she was evaluated by endocrinology 5 -- 29 -- 07 they recommend a  ultrasound, if the ultrasound show multinodal goiter then the next step would be a nuclear medicine scan ultrasound was  done in June 2007 showed: " Unremarkable thyroid ultrasound.  Tiny benign cyst in the mid portion  of the right lobe measures 1.5 mm in greatest diameter.  No other  nodules are detected" plan:  assess for  symptoms of hyperthyroidism  on return to the office, continue monitoring TFTs, consider rereferral to endocrinology please let patient know Jose E. Paz MD  November 06, 2009 11:08 AM    Follow-up for Phone Call        discussed with pt Follow-up by: Shary Decamp,  November 07, 2009 4:15 PM

## 2010-10-25 NOTE — Assessment & Plan Note (Signed)
Summary: CPX--PH   Vital Signs:  Patient profile:   65 year old female Height:      65 inches Weight:      212 pounds BMI:     35.41 Pulse rate:   64 / minute BP sitting:   138 / 80  Vitals Entered By: Shary Decamp (Feb 06, 2010 2:06 PM) CC: yearly, not fasting   History of Present Illness: insomnia-- states is geting the generic Remus Loffler although she was Rx the [employer]. needs a rx for branded ambien  DIABETES-- no ambulatory CBGs , needs a glucometer  occasionally check ambulatory BPs , normal BPs lately   DEPRESSION- well controlled ANXIETY -- occasionally anxiety at night  ?  Hyperlipidemia-- good medication compliance   ASTHMA-- just saw  Dr Dallie Dad   yearly check up, chart reviewed   Preventive Screening-Counseling & Management  Caffeine-Diet-Exercise     Caffeine use/day: 4      Drug Use:  no.    Allergies: No Known Drug Allergies  Past History:  Past Medical History: G0 P0  menopause Increased BP w/cardiolite DIABETES MELLITUS, TYPE II, BORDERLINE  DEPRESSION  ANXIETY   GERD   GOITER, MULTINODULAR   Hyperlipidemia ASTHMA (h/o IgG deficiency, bronchiectasis), sees Dr Dallie Dad , previously saw  Dr Sherene Sires    Past Surgical History: Thyroid Ultrasound (02/24/2006) Hysterectomy, partial Tonsillectomy had a Bronchoscopy in the past  eye  Lid surgery (cosmetic)  Family History: Reviewed history from 09/14/2009 and no changes required. lung CA-- F colon ca--no breast ca--no DM--F MI-- sister at age 38  PVD-- M S/P amputations of UE & LE    Social History: Reviewed history from 09/14/2009 and no changes required. moved from [place] aprox 2003  Married 2 kids -- adopted  Retired tobacco-- quit at age 30 ETOH-- rarely  Regular exercise--no Caffeine use/day:  4 Drug Use:  no  Review of Systems General:  Denies fever and weight loss. GI:  Denies bloody stools, diarrhea, and vomiting; occasional nausea. GU:  Denies abnormal vaginal bleeding,  discharge, and dysuria.  Physical Exam  General:  alert, well-developed, and overweight-appearing.   Breasts:  No mass, nodules, thickening, tenderness, bulging, retraction, inflamation, nipple discharge or skin changes noted.   Lungs:  normal respiratory effort and no intercostal retractions.  a few rhonchi bilaterally, no respiratory distress Heart:  normal rate, regular rhythm, and no murmur.   Abdomen:  soft, non-tender, no distention, no masses, no guarding, and no rigidity.   Extremities:  no pretibial edema bilaterally  Psych:  Cognition and judgment appear intact. Alert and cooperative with normal attention span and concentration.     Impression & Recommendations:  Problem # 1:  THYROID STIMULATING HORMONE, ABNORMAL (ICD-246.9)  patient has a history of low  TSH on-and-off for years she was evaluated by endocrinology 5 -- 29 -- 07 they recommend a  ultrasound, if the ultrasound show multinodal goiter then the next step would be a nuclear medicine scan ultrasound was  done in June 2007 showed: " Unremarkable thyroid ultrasound.  Tiny benign cyst in the mid portion  of the right lobe measures 1.5 mm in greatest diameter.  No other  nodules are detected" plan:   continue monitoring TFTs, consider rereferral to endocrinology    Problem # 2:  HEALTH SCREENING (ICD-V70.0) Td 03 pneumonia shot 2008  had at least 2  Cscope,  Dr Corinda Gubler, patient reports polyps ---unclear when his next colonoscopy, will contact GI  gyn--not seen in > 6  years , h/o hysterectomy at age 80, after that she had several PAPs, never had an abnormal one ; offered referal to gyn, pt not really interested in having more PAPs ; Will revisit the issue next year last [facility] 4-10 (-) , plans to get that schedule  breast exam today (-)     Problem # 3:  HYPERLIPIDEMIA (ICD-272.4) due for lab, seen instructions Her updated medication list for this problem includes:    Lipitor 80 Mg Tabs (Atorvastatin calcium) .Marland Kitchen...  1 by mouth once daily  Labs Reviewed: SGOT: 21 (02/15/2009)   SGPT: 24 (02/15/2009)   HDL:41.40 (02/15/2009), 35.8 (02/19/2008)  LDL:71 (02/15/2009), DEL (82/95/6213)  Chol:140 (02/15/2009), 191 (02/19/2008)  Trig:136.0 (02/15/2009), 317 (02/19/2008)  Problem # 4:  INSOMNIA-SLEEP DISORDER-UNSPEC (ICD-780.52) needs branded Ambien CR, see prescriptions Her updated medication list for this problem includes:    Ambien Cr 12.5 Mg Cr-tabs (Zolpidem tartrate) .Marland Kitchen... 1 by mouth at bedtime  Problem # 5:  DM (ICD-250.00) feet  and eye care   discussed due for labs, on diet only   Labs Reviewed: Creat: 0.8 (02/15/2009)    Reviewed HgBA1c results: 6.7 (02/15/2009)  6.7 (02/19/2008)  Problem # 6:  ANXIETY (ICD-300.00) no change for now Her updated medication list for this problem includes:    Citalopram Hydrobromide 20 Mg Tabs (Citalopram hydrobromide) .Marland Kitchen..Marland Kitchen Two times a day - due office visit for additional refills  Problem # 7:  OSTEOPENIA (ICD-733.90) osteopenia per bone density test 02/2009 In that time was recommended calcium, vitamin D and exercise  Complete Medication List: 1)  Flonase 50 Mcg/act Susp (Fluticasone propionate) .... Spray 1 spray into both nostrils twice a day 2)  Ventolin Hfa 108 (90 Base) Mcg/act Aers (Albuterol sulfate) .... 2 puff every 4 hours as needed 3)  Singulair 10 Mg Tabs (Montelukast sodium) .... Take 1 tablet by mouth at bedtime 4)  Advair Diskus 500-50 Mcg/dose Aepb (Fluticasone-salmeterol) .... Two times a day 5)  Tussionex Pennkinetic Er 8-10 Mg/35ml Lqcr (Chlorpheniramine-hydrocodone) .... Take 1 teaspoon by mouth twice a day 6)  Omeprazole 20 Mg Cpdr (Omeprazole) .... Bid 7)  Lipitor 80 Mg Tabs (Atorvastatin calcium) .Marland Kitchen.. 1 by mouth once daily 8)  Verapamil Hcl Cr 120 Mg Tbcr (Verapamil hcl) .... Take 1 tablet by mouth once a day 9)  Citalopram Hydrobromide 20 Mg Tabs (Citalopram hydrobromide) .... Two times a day - due office visit for additional  refills 10)  Ambien Cr 12.5 Mg Cr-tabs (Zolpidem tartrate) .Marland Kitchen.. 1 by mouth at bedtime  Patient Instructions: 1)  please come back fasting 2)  FLP AST ALT----dx  hyperlipidemia 3)  TSH, free T3 , free T4 ----dx abnormal TSH 4)  CBC, BMP, A1c, microalbumin---- dx  diabetes 5)  Vitamin D----- dx  osteopenia 6)  Please schedule a follow-up appointment in 6 months .  Prescriptions: AMBIEN CR 12.5 MG CR-TABS (ZOLPIDEM TARTRATE) 1 by mouth at bedtime Brand medically necessary #90 x 0   Entered and Authorized by:   Nolon Rod. Paz MD   Signed by:   Nolon Rod. Paz MD on 02/06/2010   Method used:   Print then Give to Patient   RxID:   [id number]      Risk Factors:  Tobacco use:  quit    Year quit:  age 30 Drug use:  no Caffeine use:  4 drinks per day Alcohol use:  no Exercise:  no  Colonoscopy History:    Date of Last Colonoscopy:  01/30/2007  Mammogram History:  Date of Last Mammogram:  01/12/2009  @  [facility]    Preventive Care Screening  Prior Values:    Mammogram:  ASSESSMENT: Negative - BI-RADS 1^MM DIGITAL SCREENING (01/12/2009)    Colonoscopy:  Done (01/30/2007)    Last Tetanus Booster:  Historical (06/23/2002)    Last Flu Shot:  Fluvax 3+ (07/27/2009)    Last Pneumovax:  Pneumovax (04/24/2007)

## 2010-10-25 NOTE — Assessment & Plan Note (Signed)
Summary: RIGHT SWOLLEN KNEE AND LEFT EYE SWOLLEN//PH   Vital Signs:  Patient profile:   65 year old female Weight:      216 pounds Pulse rate:   88 / minute BP sitting:   130 / 70  (left arm)  Vitals Entered By: Doristine Devoid CMA (April 27, 2010 2:46 PM) CC: R knee swollen and painful and L eye redness painful    History of Present Illness: [name] Rogers here today for   1) R knee pain- pain anteriorly, swelling for the last few days.  first started 1-2 months ago.  most noticeable w/ increased activity (walking).  no relief w/ tylenol.  2) L eye redness- sxs started yesterday.  + pain w/ palpation or blinking.  no drainage from the eye.  pt doesn't think actual eye is red, more the skin around it.  admits to rubbing eyes frequently but no recent trauma or bug bite.  no fevers but feeling run down.  no visual changes.  Current Medications (verified): 1)  Flonase 50 Mcg/act Susp (Fluticasone Propionate) .... Spray 1 Spray Into Both Nostrils Twice A Day 2)  Ventolin Hfa 108 (90 Base) Mcg/act  Aers (Albuterol Sulfate) .... 2 Puff Every 4 Hours As Needed 3)  Singulair 10 Mg Tabs (Montelukast Sodium) .... Take 1 Tablet By Mouth At Bedtime 4)  Tussionex Pennkinetic Er 8-10 Mg/71ml Lqcr (Chlorpheniramine-Hydrocodone) .... Take 1 Teaspoon By Mouth Twice A Day 5)  Omeprazole 20 Mg Cpdr (Omeprazole) .... Bid 6)  Lipitor 80 Mg  Tabs (Atorvastatin Calcium) .Marland Kitchen.. 1 By Mouth Once Daily 7)  Verapamil Hcl Cr 120 Mg Tbcr (Verapamil Hcl) .... Take 1 Tablet By Mouth Once A Day 8)  Citalopram Hydrobromide 20 Mg Tabs (Citalopram Hydrobromide) .... Two Times A Day - Due Office Visit For Additional Refills 9)  Ambien Cr 12.5 Mg Cr-Tabs (Zolpidem Tartrate) .Marland Kitchen.. 1 By Mouth At Bedtime 10)  Ergocalciferol 50000 Unit Caps (Ergocalciferol) .Marland Kitchen.. 1 By Mouth Every Week X 12 Weeks  Allergies (verified): No Known Drug Allergies  Past History:  Past Medical History: Last updated: 02/06/2010 G0 P0   menopause Increased BP w/cardiolite DIABETES MELLITUS, TYPE II, BORDERLINE  DEPRESSION  ANXIETY   GERD   GOITER, MULTINODULAR   Hyperlipidemia ASTHMA (h/o IgG deficiency, bronchiectasis), sees Dr Dallie Dad , previously saw  Dr Sherene Sires    Past Surgical History: Last updated: 02/06/2010 Thyroid Ultrasound (02/24/2006) Hysterectomy, partial Tonsillectomy had a Bronchoscopy in the past  eye  Lid surgery (cosmetic)  Review of Systems      See HPI  Physical Exam  General:  alert, well-developed, and overweight-appearing.   Eyes:  L eye w/out injxn or inflammation, area inferior to eye is swollen, erythematous, warm.  no TTP over globe.  no fluctuance.  PERRL, EOMI. Msk:  R knee w/ mild edema, no erythema.  no joint line tenderness.  + TTP over quad tendon and insertion at patella.  good flexion and extension.  Pulses:  +2 DP/PT   Impression & Recommendations:  Problem # 1:  PERIORBITAL CELLULITIS (ICD-376.01) Assessment New pt w/out actual eye involvement but area around eye consistent w/ cellulitis.  pt to start Keflex.  reviewed red flags that should prompt immediate return- pt verbalized understanding.  Problem # 2:  KNEE PAIN (JXB-147.82) Assessment: New  pt's pain localized to the anterior knee, over the patella and quad tendon.  start scheduled NSAIDs and ice.  pt to call if no improvement. Her updated medication list for this  problem includes:    Naproxen 500 Mg Tabs (Naproxen) .Marland Kitchen... 1 tab by mouth two times a day x7-10 days and then as needed.  take w/ food  Orders: Prescription Created Electronically [id number]  Complete Medication List: 1)  Flonase 50 Mcg/act Susp (Fluticasone propionate) .... Spray 1 spray into both nostrils twice a day 2)  Ventolin Hfa 108 (90 Base) Mcg/act Aers (Albuterol sulfate) .... 2 puff every 4 hours as needed 3)  Singulair 10 Mg Tabs (Montelukast sodium) .... Take 1 tablet by mouth at bedtime 4)  Tussionex Pennkinetic Er 8-10 Mg/28ml Lqcr  (Chlorpheniramine-hydrocodone) .... Take 1 teaspoon by mouth twice a day 5)  Omeprazole 20 Mg Cpdr (Omeprazole) .... Bid 6)  Lipitor 80 Mg Tabs (Atorvastatin calcium) .Marland Kitchen.. 1 by mouth once daily 7)  Verapamil Hcl Cr 120 Mg Tbcr (Verapamil hcl) .... Take 1 tablet by mouth once a day 8)  Citalopram Hydrobromide 20 Mg Tabs (Citalopram hydrobromide) .... Two times a day - due office visit for additional refills 9)  Ambien Cr 12.5 Mg Cr-tabs (Zolpidem tartrate) .Marland Kitchen.. 1 by mouth at bedtime 10)  Naproxen 500 Mg Tabs (Naproxen) .Marland Kitchen.. 1 tab by mouth two times a day x7-10 days and then as needed.  take w/ food  Patient Instructions: 1)  Please call if your eye is at any time worsening- you will need to be seen to avoid involvement of the eye 2)  Take the cephalexin (antibiotic) three times a day as directed 3)  Use ice on both the knee and the eye for swelling 4)  Take the Naproxen as directed for knee inflammation 5)  Listen to your body- if it hurts, don't do it! 6)  Call with any questions or concerns 7)  Hang in there!! Prescriptions: NAPROXEN 500 MG TABS (NAPROXEN) 1 tab by mouth two times a day x7-10 days and then as needed.  take w/ food  #60 x 0   Entered and Authorized by:   Neena Rhymes MD   Signed by:   Neena Rhymes MD on 04/27/2010   Method used:   Print then Give to Patient   RxID:   [id number] CEPHALEXIN 500 MG  TABS (CEPHALEXIN) take one by mouth three times a day x7 days  #21 x 0   Entered and Authorized by:   Neena Rhymes MD   Signed by:   Neena Rhymes MD on 04/27/2010   Method used:   Print then Give to Patient   RxID:   [id number]

## 2010-10-25 NOTE — Progress Notes (Signed)
Summary: lipitor--took 2 80mg   Phone Note Call from Patient Call back at Home Phone [phone]   Caller: Patient Summary of Call: Pt called and states that she was confused and thought she was to take 2 80mg  Lipitor a day. I told pt that it was the Verampril that she was to increase to 180mg . Does pt need to come in for Liver Function Test? She states that she is feeling okay otherwise.  Initial call taken by: Army Fossa CMA,  September 03, 2010 3:04 PM  Follow-up for Phone Call        if she took extra Lipitor for few days, it should be okay. Call if nausea, vomiting, myalgias. Otherwise take medication as prescribed Follow-up by: Jose E. Paz MD,  September 03, 2010 5:20 PM  Additional Follow-up for Phone Call Additional follow up Details #1::        Pt is aware. She denies any nausea, vomiting, myalgias. Army Fossa CMA  September 04, 2010 9:13 AM

## 2010-10-25 NOTE — Consult Note (Signed)
Summary: Endocrinology Consult/[place]   Endocrinology Consult/[facility]   Imported By: Lanelle Bal 11/09/2009 12:40:46  _____________________________________________________________________  External Attachment:    Type:   Image     Comment:   External Document

## 2010-10-25 NOTE — Assessment & Plan Note (Signed)
Summary: needs ov/kdc   Vital Signs:  Patient profile:   65 year old female Height:      65 inches Pulse rate:   84 / minute BP sitting:   120 / 80  Vitals Entered By: Shary Decamp (October 18, 2009 11:49 AM) CC: rov   History of Present Illness: routine office visit, she was recently hospitalized hospital records are viewed labs from  December the 23rd: Hemoglobin A1c 6.6, LFTs normal, creatinine 0.8, potassium normal, hemoglobin 13.9  Current Medications (verified): 1)  Flonase 50 Mcg/act Susp (Fluticasone Propionate) .... Spray 1 Spray Into Both Nostrils Twice A Day 2)  Ventolin Hfa 108 (90 Base) Mcg/act  Aers (Albuterol Sulfate) .... 2 Puff Every 4 Hours As Needed 3)  Singulair 10 Mg Tabs (Montelukast Sodium) .... Take 1 Tablet By Mouth At Bedtime 4)  Advair Diskus 500-50 Mcg/dose Aepb (Fluticasone-Salmeterol) .... Two Times A Day 5)  Tussionex Pennkinetic Er 8-10 Mg/10ml Lqcr (Chlorpheniramine-Hydrocodone) .... Take 1 Teaspoon By Mouth Twice A Day 6)  Omeprazole 20 Mg Cpdr (Omeprazole) .... Bid 7)  Lipitor 80 Mg  Tabs (Atorvastatin Calcium) .Marland Kitchen.. 1 By Mouth Once Daily 8)  Verapamil Hcl Cr 120 Mg Tbcr (Verapamil Hcl) .... Take 1 Tablet By Mouth Once A Day 9)  Citalopram Hydrobromide 20 Mg Tabs (Citalopram Hydrobromide) .... Two Times A Day 10)  Celebrex 100 Mg  Caps (Celecoxib) .Marland Kitchen.. 1 By Mouth Bid 11)  Ambien Cr 12.5 Mg Cr-Tabs (Zolpidem Tartrate) .Marland Kitchen.. 1 By Mouth At Bedtime  Allergies (verified): No Known Drug Allergies  Past History:  Past Medical History: Menopause Increased BP w/cardiolite DIABETES MELLITUS, TYPE II, BORDERLINE  DEPRESSION  ANXIETY   GERD   GOITER, MULTINODULAR   Hyperlipidemia ASTHMA (h/o IgG deficiency, bronchiectasis), sees Dr Dallie Dad , previously saw  Dr Sherene Sires before   Social History: Reviewed history from 09/14/2009 and no changes required. moved from [place] aprox 2003  Married 2 kids -- adopted  Retired tobacco-- quit at age 51 ETOH--  rarely  Regular exercise-no  Review of Systems       DIABETES -- not checking CBGs, watching diet DEPRESSION , ANXIETY -- well controlled   GOITER, MULTINODULAR  -- last TSH low, due to repeat Hyperlipidemia-- good medication compliance  ASTHMA-- symptoms well controlled    Physical Exam  General:  alert and well-developed.   Lungs:  normal respiratory effort and no intercostal retractions.  a few rhonchi bilaterally, no respiratory distress Heart:  normal rate, regular rhythm, and no murmur.   Extremities:  no edema, right pretibial area without swelling or ecchymosis   Impression & Recommendations:  Problem # 1:  HYPERLIPIDEMIA (ICD-272.4) well controlled no change Her updated medication list for this problem includes:    Lipitor 80 Mg Tabs (Atorvastatin calcium) .Marland Kitchen... 1 by mouth once daily  Labs Reviewed: SGOT: 21 (02/15/2009)   SGPT: 24 (02/15/2009)   HDL:41.40 (02/15/2009), 35.8 (02/19/2008)  LDL:71 (02/15/2009), DEL (16/06/9603)  Chol:140 (02/15/2009), 191 (02/19/2008)  Trig:136.0 (02/15/2009), 317 (02/19/2008)  Problem # 2:  DIABETES MELLITUS, TYPE II, BORDERLINE (ICD-790.29) stable December the 23rd: Hemoglobin A1c 6.6, LFTs normal  Labs Reviewed: Creat: 0.8 (02/15/2009)     Problem # 3:  GOITER, MULTINODULAR (ICD-241.1) recheck TFTs Orders: Venipuncture (54098) TLB-TSH (Thyroid Stimulating Hormone) (84443-TSH) TLB-T3, Free (Triiodothyronine) (84481-T3FREE) TLB-T4 (Thyrox), Free [phone])  Problem # 4:  ASTHMA (ICD-493.90) symptoms apparently well controlled, she sees her allergies every 6 months, had a flu shot Her updated medication list for this problem includes:  Ventolin Hfa 108 (90 Base) Mcg/act Aers (Albuterol sulfate) .Marland Kitchen... 2 puff every 4 hours as needed    Singulair 10 Mg Tabs (Montelukast sodium) .Marland Kitchen... Take 1 tablet by mouth at bedtime    Advair Diskus 500-50 Mcg/dose Aepb (Fluticasone-salmeterol) .Marland Kitchen..Marland Kitchen Two times a day  Complete Medication  List: 1)  Flonase 50 Mcg/act Susp (Fluticasone propionate) .... Spray 1 spray into both nostrils twice a day 2)  Ventolin Hfa 108 (90 Base) Mcg/act Aers (Albuterol sulfate) .... 2 puff every 4 hours as needed 3)  Singulair 10 Mg Tabs (Montelukast sodium) .... Take 1 tablet by mouth at bedtime 4)  Advair Diskus 500-50 Mcg/dose Aepb (Fluticasone-salmeterol) .... Two times a day 5)  Tussionex Pennkinetic Er 8-10 Mg/55ml Lqcr (Chlorpheniramine-hydrocodone) .... Take 1 teaspoon by mouth twice a day 6)  Omeprazole 20 Mg Cpdr (Omeprazole) .... Bid 7)  Lipitor 80 Mg Tabs (Atorvastatin calcium) .Marland Kitchen.. 1 by mouth once daily 8)  Verapamil Hcl Cr 120 Mg Tbcr (Verapamil hcl) .... Take 1 tablet by mouth once a day 9)  Citalopram Hydrobromide 20 Mg Tabs (Citalopram hydrobromide) .... Two times a day 10)  Celebrex 100 Mg Caps (Celecoxib) .Marland Kitchen.. 1 by mouth bid 11)  Ambien Cr 12.5 Mg Cr-tabs (Zolpidem tartrate) .Marland Kitchen.. 1 by mouth at bedtime  Patient Instructions: 1)  Please schedule a follow-up appointment in 4 to 5 months (yearly exam-fasting)

## 2010-11-12 ENCOUNTER — Telehealth: Payer: Self-pay | Admitting: Internal Medicine

## 2010-11-12 ENCOUNTER — Encounter: Payer: Self-pay | Admitting: Internal Medicine

## 2010-11-14 NOTE — Letter (Signed)
Summary: neg eye check ----[facility] Exam/[facility]   Imported By: Maryln Gottron 10/30/2010 10:03:54  _____________________________________________________________________  External Attachment:    Type:   Image     Comment:   External Document

## 2010-11-20 NOTE — Medication Information (Signed)
Summary: Prior Authorization not required for Zolpidem Tartrate/[facility]  Prior Authorization not required for Zolpidem Tartrate/[facility]   Imported By: Maryln Gottron 11/16/2010 12:47:51  _____________________________________________________________________  External Attachment:    Type:   Image     Comment:   External Document

## 2010-11-20 NOTE — Progress Notes (Signed)
Summary: [facility]--Zolpidem  Phone Note Other Incoming   Summary of Call: The office received mail from [employer] stating that coverage on patient Zolpidem is about to expire 12/01/10. I called for paperwork/extension and was notified that it does not require prior authorization. They advised me that patient just picked up the med in Jan 2012.   **Sent note received to be scanned. Initial call taken by: Lucious Groves CMA,  November 12, 2010 11:33 AM

## 2010-12-03 ENCOUNTER — Other Ambulatory Visit: Payer: Self-pay | Admitting: Specialist

## 2010-12-03 ENCOUNTER — Ambulatory Visit
Admission: RE | Admit: 2010-12-03 | Discharge: 2010-12-03 | Disposition: A | Discharge status: Home / Self Care | Payer: Medicare Other | Source: Ambulatory Visit | Attending: Specialist | Admitting: Specialist

## 2010-12-03 DIAGNOSIS — Z01818 Encounter for other preprocedural examination: Secondary | ICD-10-CM

## 2010-12-05 ENCOUNTER — Ambulatory Visit (HOSPITAL_BASED_OUTPATIENT_CLINIC_OR_DEPARTMENT_OTHER)
Admission: RE | Admit: 2010-12-05 | Discharge: 2010-12-05 | Disposition: A | Discharge status: Home / Self Care | Payer: Medicare Other | Source: Ambulatory Visit | Attending: Specialist | Admitting: Specialist

## 2010-12-05 DIAGNOSIS — Z01812 Encounter for preprocedural laboratory examination: Secondary | ICD-10-CM | POA: Insufficient documentation

## 2010-12-05 DIAGNOSIS — J479 Bronchiectasis, uncomplicated: Secondary | ICD-10-CM | POA: Insufficient documentation

## 2010-12-05 DIAGNOSIS — K219 Gastro-esophageal reflux disease without esophagitis: Secondary | ICD-10-CM | POA: Insufficient documentation

## 2010-12-05 DIAGNOSIS — E669 Obesity, unspecified: Secondary | ICD-10-CM | POA: Insufficient documentation

## 2010-12-05 DIAGNOSIS — Z79899 Other long term (current) drug therapy: Secondary | ICD-10-CM | POA: Insufficient documentation

## 2010-12-05 DIAGNOSIS — E119 Type 2 diabetes mellitus without complications: Secondary | ICD-10-CM | POA: Insufficient documentation

## 2010-12-05 DIAGNOSIS — Z01810 Encounter for preprocedural cardiovascular examination: Secondary | ICD-10-CM | POA: Insufficient documentation

## 2010-12-05 DIAGNOSIS — Z86718 Personal history of other venous thrombosis and embolism: Secondary | ICD-10-CM | POA: Insufficient documentation

## 2010-12-05 DIAGNOSIS — H269 Unspecified cataract: Secondary | ICD-10-CM | POA: Insufficient documentation

## 2010-12-05 DIAGNOSIS — I10 Essential (primary) hypertension: Secondary | ICD-10-CM | POA: Insufficient documentation

## 2010-12-05 LAB — POCT I-STAT 4, (NA,K, GLUC, HGB,HCT)
Glucose, Bld: 114 mg/dL — ABNORMAL HIGH (ref 70–99)
HCT: 37 % (ref 36.0–46.0)
Hemoglobin: 12.6 g/dL (ref 12.0–15.0)
Potassium: 3.9 mEq/L (ref 3.5–5.1)

## 2010-12-05 LAB — GLUCOSE, CAPILLARY: Glucose-Capillary: 123 mg/dL — ABNORMAL HIGH (ref 70–99)

## 2010-12-11 ENCOUNTER — Other Ambulatory Visit: Payer: Self-pay | Admitting: Internal Medicine

## 2010-12-12 MED ORDER — ZOLPIDEM TARTRATE ER 12.5 MG PO TBCR: 12.5000 mg | EXTENDED_RELEASE_TABLET | Freq: Every evening | ORAL | Status: DC | PRN

## 2010-12-12 NOTE — Telephone Encounter (Signed)
Ok 90, no RF 

## 2010-12-24 LAB — DIFFERENTIAL
Basophils Absolute: 0 10*3/uL (ref 0.0–0.1)
Eosinophils Relative: 0 % (ref 0–5)
Lymphocytes Relative: 5 % — ABNORMAL LOW (ref 12–46)
Lymphs Abs: 0.4 10*3/uL — ABNORMAL LOW (ref 0.7–4.0)
Neutrophils Relative %: 91 % — ABNORMAL HIGH (ref 43–77)

## 2010-12-24 LAB — BASIC METABOLIC PANEL
BUN: 21 mg/dL (ref 6–23)
Calcium: 8.2 mg/dL — ABNORMAL LOW (ref 8.4–10.5)
Creatinine, Ser: 0.81 mg/dL (ref 0.4–1.2)
GFR calc non Af Amer: >60 mL/min (ref >60)

## 2010-12-24 LAB — HEPATIC FUNCTION PANEL
ALT: 25 U/L (ref 0–35)
Albumin: 3.8 g/dL (ref 3.5–5.2)
Alkaline Phosphatase: 62 U/L (ref 39–117)
Indirect Bilirubin: 0.8 mg/dL (ref 0.3–0.9)
Total Protein: 6.5 g/dL (ref 6.0–8.3)

## 2010-12-24 LAB — CBC
HCT: 41.3 % (ref 36.0–46.0)
Hemoglobin: 13.9 g/dL (ref 12.0–15.0)
Platelets: 222 10*3/uL (ref 150–400)
RDW: 13.7 % (ref 11.5–15.5)
WBC: 8.4 10*3/uL (ref 4.0–10.5)

## 2010-12-24 LAB — GLUCOSE, CAPILLARY: Glucose-Capillary: 131 mg/dL — ABNORMAL HIGH (ref 70–99)

## 2010-12-24 LAB — LIPASE, BLOOD: Lipase: 13 U/L (ref 11–59)

## 2010-12-24 LAB — AMYLASE: Amylase: 37 U/L (ref 0–105)

## 2010-12-27 ENCOUNTER — Telehealth: Payer: Self-pay | Admitting: Internal Medicine

## 2010-12-27 NOTE — Telephone Encounter (Signed)
I spoke w/ pt she is aware.  

## 2010-12-27 NOTE — Telephone Encounter (Signed)
Advise patient, I received a letter from [facility], there is a interaction between omeprazole and citalopram. Recommend to discontinue omeprazole and take Zantac 75 milligrams over-the-counter twice a day. If that doesn't work, we'll have to discuss further treatment on the next appointment [facility]

## 2011-01-31 ENCOUNTER — Encounter: Payer: Self-pay | Admitting: Internal Medicine

## 2011-01-31 ENCOUNTER — Ambulatory Visit (INDEPENDENT_AMBULATORY_CARE_PROVIDER_SITE_OTHER): Payer: Medicare Other | Admitting: Internal Medicine

## 2011-01-31 DIAGNOSIS — M545 Low back pain, unspecified: Secondary | ICD-10-CM

## 2011-01-31 DIAGNOSIS — M549 Dorsalgia, unspecified: Secondary | ICD-10-CM

## 2011-01-31 MED ORDER — HYDROCODONE-ACETAMINOPHEN 10-500 MG PO TABS: 1.0000 | ORAL_TABLET | ORAL | Status: AC | PRN

## 2011-01-31 NOTE — Assessment & Plan Note (Signed)
For this history of right-sided low back pain, the pain changes depending on her position. There is a question of a decreased DTR in the right ankle. Plan: Conservative treatment. See instructions.

## 2011-01-31 NOTE — Progress Notes (Signed)
  Subjective:    Patient ID: [name] Rogers, female    DOB: [birth year], 65 y.o.   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 Pain does not radiate, is the worst when she is standing straight, if she bends forward the pain essentially resolves. No radiation.  Past Medical History  Diagnosis Date  . Menopause   . Increased blood pressure (not hypertension)     w/ cardiolite  . Diabetes mellitus   . Depression   . Anxiety   . GERD (gastroesophageal reflux disease)   . Goiter     multinodular  . Hyperlipidemia   . Asthma     h/o IgG deficiency, bronchiectasis, sees Dr.Kuzlow, previously saw Dr.Wert  . Hypertension    Past Surgical History  Procedure Date  . Thryoid ultrasound 02/24/2006  . Partial hysterectomy   . Tonsillectomy   . Eye lid surgery     cosemtic    Review of Systems Denies fevers No bladder or bowel incontinence No lower extremity paresthesias or rash. No abdominal pain.     Objective:   Physical Exam Alert oriented in no apparent distress Abdomen, soft, nontender. Extremities: No lower extremity edema, knees and hips with normal range of motion without any pain. Back, not tender to palpation, nor rash. Neurological exam, motor is symmetric, DTRs are symmetric (slightly decreased right ankle jerk?). Straight leg test negative.       Assessment & Plan:

## 2011-01-31 NOTE — Patient Instructions (Addendum)
Motrin 200 mg OTC: 3 tabs every 8 hours as needed (take with food, may cause stomach pain-nausea) If the pain continue, take vicodin (may cause drowsiness) Heating pad, no heavy lifting. Call if pain is severe or no better in 10-12 days  Call if fever, rash. You are due for a check up, please schedule at your convenience

## 2011-02-05 NOTE — Assessment & Plan Note (Signed)
[facility]                             PULMONARY OFFICE NOTE   NAME:[name] Rogers, [name] Rogers                    MRN:          [id number]  DATE:05/13/2007                            DOB:          1946-02-24    HISTORY OF PRESENT ILLNESS:  The patient is a 65 year old white female  patient of Dr. Thurston Hole who has a history of chronic asthmatic bronchitis  associated with a history of sinusitis and bronchiectasis, and an IgG  subclass deficiency, who presents today for a 2-week followup.  The  patient was recently seen in the office for a pulmonary consult for  persistent cough and dyspnea.  The patient had been felt to have some  possible upper airway instability, possibly secondary to reflux.  The  patient was recommended to continue on a reflux-preventive diet, along  with Nexium daily.  The patient had been given Mucinex DM and tramadol  for cough control, and Advair was decreased down to 250/50.  The patient  does have followup appointment in a couple of a weeks for a full set of  pulmonary function tests to evaluate her lung function.  Since last  visit, the patient reports she has noticed some improvement with  decreased cough and shortness of breath, however, the cough is not  totally resolved.   PAST MEDICAL HISTORY:  Reviewed.   CURRENT MEDICATIONS:  Reviewed.   PHYSICAL EXAMINATION:  The patient is a female in no acute distress.  She is afebrile.  Blood pressure is 132/78, O2 saturation is 93% on room  air.  HEENT:  Nasal mucosa reveals some mild erythema, nontender sinuses.  NECK:  Supple without adenopathy, no JVD.  Lung sounds reveal some coarse rhonchi bilaterally.  CARDIAC:  Regular rate.  ABDOMEN:  Soft and nontender.  EXTREMITIES:  Warm without any edema.   IMPRESSION AND PLAN:  1. Chronic asthmatic bronchitis with associated history of sinusitis      and bronchiectasis with an IgG subclass deficiency.  The patient      will continue  on present regimen of Advair 250/50, along with      maximizing Mucinex DM and tramadol to help with cough control, and      Nexium for reflux prevention.  The patient will return here as      scheduled for chest x-ray and pulmonary function tests in 2 weeks.  2. Complex medication regimen.  The patient's medication is reviewed      in detail.  Patient      education was provided.  The patient's computerized medication      calendar was completed and reviewed with this patient.      Rubye Oaks, NP  Electronically Signed      Charlaine Dalton. Sherene Sires, MD, [facility]  Electronically Signed   TP/MedQ  DD: 05/14/2007  DT: 05/15/2007  Job #: [id number]

## 2011-02-05 NOTE — Assessment & Plan Note (Signed)
[facility]                             PULMONARY OFFICE NOTE   NAME:[name] Rogers, [name] Rogers                    MRN:          [id number]  DATE:04/29/2007                            DOB:          1946-07-22    PULMONARY CONSULTATION   REASON FOR CONSULTATION:  This is a pulmonary consultation requested by  Dr. Willow Ora for dyspnea/cough.   HISTORY OF PRESENT ILLNESS:  This is a 65 year old white female who  states she has been coughing all of her life.  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  Lucie Leather by this office in 2002 with evidence of both chronic rhinitis,  sinusitis and asthmatic bronchitis with bronchiectasis.  She said she  had tried several immunoglobulin shots for about a year and also  allergy shots for about a year.  She did not feel they made any  difference in terms of her chronic symptoms of cough on a daily basis,  which is typically thick, white and intermittently purulent (up to four  times a year) and in between these exacerbations is discouraged that she  can't get to the mailbox and back [place] because  of dyspnea.  She denies presently any purulent sputum or active sinus  complaints, overt reflux symptoms, orthopnea, PND or leg swelling,  myalgias, arthralgias or history of rheumatologic disease.   PAST MEDICAL HISTORY:  Significant for depression and hyperlipidemia.  Her last echocardiogram in 2004 was normal with no evidence of right  ventricular abnormalities.   ALLERGIES:  None known.   MEDICATIONS:  1. Advair two puffs a day.  2. Flonase one puff b.i.d.  3. Nexium 40 mg b.i.d.  4. Lexapro 10 mg b.i.d.  5. Singulair 10 mg daily.  6. Lipitor 40 mg daily.  7. Verapamil 20 mg daily.  8. Tussionex p.r.n.   SOCIAL HISTORY:  She quit smoking 34 years ago.  She denies any unusual  travel or hobby exposure.   FAMILY HISTORY:  Positive for rheumatism in  her mother.   REVIEW OF SYSTEMS:  Taken in detail on the work sheet and is negative  except as outlined above.   PHYSICAL EXAMINATION:  GENERAL APPEARANCE:  This is a pleasant, but  somewhat anxious-appearing ambulatory white female in no acute distress.  VITAL SIGNS:  She is afebrile with stable vital signs.  HEENT:  Unremarkable.  Pharynx clear.  There is no evidence of excessive  postnasal drainage, cobblestoning or thrush.  Nasal turbinates reveal  minimal edema, nonspecific.  Ear canals are clear bilaterally.  NECK:  Supple without cervical adenopathy or tenderness.  Trachea is  midline.  No thyromegaly.  Carotid upstrokes are brisk bilaterally.  LUNGS:  Reveal a few inspiratory and expiratory rhonchi but overall air  movement was adequate.  HEART:  Has regular rate and rhythm without murmurs, rubs or gallops.  No increase in PT.  ABDOMEN:  Soft, benign.  EXTREMITIES:  Warm without calf tenderness, cyanosis, clubbing or edema.   LABORATORY DATA:  Hemoglobin saturation was 95% on  room air.   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  Since she has already tried maximum treatment directed at both allergy  and to correct the IgG deficiency, without any benefit, I would like an  opportunity to see to what extent we can improve her symptoms by  treating her aggressively and consistently for a period of three to six  months.  During this time I have asked her to let me see her for any  exacerbations that might occur, although she says she has one point of  care to regulate her medicines more consistently.  If she is not willing  to do this, then there would be no reason to follow her here in the  pulmonary clinic and I will certainly be happy to refer her back to Dr.  Lucie Leather for longterm purposes.    RECOMMENDATIONS:  1. For now I have recommended reducing Advair dose to 250/50 one      b.i.d. because most of her symptoms at present relate to cough and      I am not convinced there is enough of a dose response here to      benefit from a higher dose.  2. Her dyspnea on exertion needs to be addressed with a full set of      pulmonary function tests and a chest x-ray, which I have asked her      to schedule on return.  3. In the meantime, she needs to take Nexium consistently, 30 to 60      minutes before her first and last meals daily (not with meals as      she is presently doing) and observe a GERD diet which I reviewed      with her in writing.  That is, the plan is to treat her      aggressively for reflux, which was Dr. Kathyrn Lass intent as well,      while trying to sort out the extent that she has true primary      airways disease.  4. To treat her cough, I recommended Mucinex DM supplemented with      Tramadol and avoidance of Tussionex.  To treat her wheezing and      shortness of breath she can use albuterol two puffs every 4 hours      and today I went over how to use this more effectively.  5. I have arranged to see her back in four to six weeks with a set of      pulmonary function tests.  We will certainly see her sooner if she      has an exacerbation.  In the meantime she is to return for a      assessment verify visit to construct a medications calendar and      make sure we      are all reading from the same page in terms of medication      administration.  I told her without this element in her care that      she will not get quality medical care here.     Charlaine Dalton. Sherene Sires, MD, [facility]  Electronically Signed    MBW/MedQ  DD: 04/30/2007  DT: 04/30/2007  Job #: [id number]   cc:   Jessica Priest, M.D.  Willow Ora, MD

## 2011-02-05 NOTE — Assessment & Plan Note (Signed)
[facility]                             PULMONARY OFFICE NOTE   NAME:[name], [name]                    MRN:          [id number]  DATE:06/10/2007                            DOB:          24-Feb-1946    PULMONARY EXTENDED SUMMARY FOLLOWUP VISIT   HISTORY:  A 65 year old white female who has documented IgG deficiency  and atopic rhinitis/asthma per Dr.  Kathyrn Lass evaluation, doing much  better now actually paradoxically with a lower dose of Advair namely  250/50 b.i.d. with less cough. She continues to be short of breath with  anything more than slow ADLs, but admits that the main thing that makes  her short of breath is if she goes up a flight of steps. She has not  tried walking outside and was very confused when I asked her Are you  ever short of breath when you are not coughing? and never did actually  respond to that question. She denies any excessive sputum production,  fevers, chills, sweats, orthopnea or PND or leg swelling.   She did have a flare up of cough several weeks ago requesting an  antibiotic that was called in by Dr.  Drue Novel (Zithromax) and actually did  fine with this, now back to baseline.   Her medications were reviewed and they are in detail and entered on the  face sheet, dated June 10, 2007, correct as listed and corresponds  nicely to her medication calendar. However, she did not bring that with  her as requested.   PHYSICAL EXAMINATION:  She is a somewhat depressed appearing ambulatory  white female in no acute distress. She has stable vital signs.  HEENT: Is remarkable for mild turbinate edema. Oropharynx is clear.  NECK: Supple without cervical adenopathy or tenderness. Trachea is  midline without lymphadenopathy.  LUNGS: Lung fields are clear bilaterally to auscultation and percussion.  HEART: Regular rate and rhythm without murmur, gallop or rub.  ABDOMEN: Soft, benign.  EXTREMITIES: Warm without calf tenderness,  cyanosis, clubbing or edema.   CHEST X-RAY: Was normal today. PFTs were also interestingly normal today  except for a disproportionate reduction in expiratory reserve volume  consistent with the effects of obesity.   IMPRESSION:  Paradoxically, this patient is improved on lower dose of  Advair with less cough. I suspect therefore that the Advair was actually  irritating the upper airway more than it was helping the lower airway. I  am going to take this strategy one more step and ask her to switch over  to Symbicort 160/4.5 two puffs b.i.d. I emphasized to her with given the  PFTs that she has we actually may be able to reduce the Symbicort even  further on next visit because she has no evidence of airflow obstruction  and should be able to do a lot more than her ADLs without becoming short  of breath.   This brings up the issue however of significant obesity with  deconditioning which I reviewed with the patient in detail in terms of  calorie balance issues. That is, she needs to exercise to the level  where  she is short of breath but not out of breath daily and then give  [place] feedback in terms of response to therapy or need for titrating her  medications up or down. I spent almost 30 minutes with her today going  over these principals and re-writing her medication calendar for her  emphasizing that she should keep it with her, use it daily and return  with it on each office visit for further titration if needed.     [name] Rogers. [name] Sires, MD, [facility]  Electronically Signed    MBW/MedQ  DD: 06/10/2007  DT: 06/10/2007  Job #: [id number]   cc:   Willow Ora, MD

## 2011-02-06 ENCOUNTER — Other Ambulatory Visit: Payer: Self-pay | Admitting: Internal Medicine

## 2011-02-20 ENCOUNTER — Other Ambulatory Visit: Payer: Self-pay | Admitting: Internal Medicine

## 2011-02-22 ENCOUNTER — Other Ambulatory Visit: Payer: Self-pay | Admitting: Internal Medicine

## 2011-02-22 MED ORDER — HYDROCODONE-ACETAMINOPHEN 5-500 MG PO TABS: 1.0000 | ORAL_TABLET | ORAL | Status: DC | PRN

## 2011-02-22 NOTE — Telephone Encounter (Signed)
Pt states she uses for her back, aware it may be Monday before med can be filled.

## 2011-02-22 NOTE — Telephone Encounter (Signed)
Vicodin 5/500 one every 4 hours as needed for pain, #40, no refills

## 2011-02-26 MED ORDER — HYDROCODONE-ACETAMINOPHEN 5-500 MG PO TABS
1.0000 | ORAL_TABLET | ORAL | Status: DC | PRN
Start: 2011-02-26 — End: 2011-05-22

## 2011-02-26 NOTE — Telephone Encounter (Signed)
Addended by: Army Fossa R on: 02/26/2011 01:22 PM   Modules accepted: Orders

## 2011-02-26 NOTE — Telephone Encounter (Signed)
Resent in rx

## 2011-02-26 NOTE — Telephone Encounter (Signed)
Patient says pharmacy has not received this prescription yet

## 2011-03-11 ENCOUNTER — Telehealth: Payer: Self-pay | Admitting: Internal Medicine

## 2011-03-11 DIAGNOSIS — M549 Dorsalgia, unspecified: Secondary | ICD-10-CM

## 2011-03-11 NOTE — Telephone Encounter (Signed)
pls advise

## 2011-03-11 NOTE — Telephone Encounter (Signed)
Please refer to ortho 

## 2011-03-11 NOTE — Telephone Encounter (Signed)
Patient was seen a few weeks ago for back pain she said pain med & heat didn't help wants to know if she can be referred to specialist

## 2011-03-11 NOTE — Telephone Encounter (Signed)
Left detailed message that we are referring her to ortho.

## 2011-04-03 ENCOUNTER — Other Ambulatory Visit: Payer: Self-pay | Admitting: *Deleted

## 2011-04-03 NOTE — Telephone Encounter (Signed)
Last refill 12/11/10 # 90, no rfs.

## 2011-04-04 MED ORDER — ZOLPIDEM TARTRATE ER 12.5 MG PO TBCR: 12.5000 mg | EXTENDED_RELEASE_TABLET | Freq: Every evening | ORAL | Status: DC | PRN

## 2011-04-04 NOTE — Telephone Encounter (Signed)
Ok 90, 1 RF 

## 2011-04-09 MED ORDER — ZOLPIDEM TARTRATE ER 12.5 MG PO TBCR: 12.5000 mg | EXTENDED_RELEASE_TABLET | Freq: Every evening | ORAL | Status: DC | PRN

## 2011-04-09 NOTE — Telephone Encounter (Signed)
Addended by: Army Fossa R on: 04/09/2011 11:37 AM   Modules accepted: Orders

## 2011-04-09 NOTE — Telephone Encounter (Signed)
Patient states that [facility] has not heard from [place] about patient's Zolpidem prescription--please resend  (patient said that [facility] told her that office had an issue with this prescription??)

## 2011-04-09 NOTE — Telephone Encounter (Signed)
Sent in

## 2011-04-16 ENCOUNTER — Telehealth: Payer: Self-pay | Admitting: Internal Medicine

## 2011-04-16 MED ORDER — PREDNISONE 20 MG PO TABS: 20.0000 mg | ORAL_TABLET | Freq: Every day | ORAL | Status: AC

## 2011-04-16 NOTE — Telephone Encounter (Signed)
Prednisone 20 mg one by mouth daily for 5 days, only 5 tablets, no refill. If her blood sugar goes over 180, she is to stop taking prednisone and let me know. Office visit if symptoms worse or  Severe, or if she has  fever or if she is not better in 2-3 days

## 2011-04-16 NOTE — Telephone Encounter (Signed)
Message left for patient to return my call.  

## 2011-04-16 NOTE — Telephone Encounter (Signed)
Spoke w/ says she has developed some chest congestion states she has lung issue tried to contact allergist but he is on vacation. Says that he usually gives her a prescription for prednisone. Would like to know if Dr. Drue Novel would ok rx informed pt that we usually require office visit but will forward to Dr. Drue Novel to advise.

## 2011-04-17 NOTE — Telephone Encounter (Signed)
Pt is aware.  

## 2011-04-18 ENCOUNTER — Ambulatory Visit (INDEPENDENT_AMBULATORY_CARE_PROVIDER_SITE_OTHER): Payer: Medicare Other | Admitting: Internal Medicine

## 2011-04-18 ENCOUNTER — Encounter: Payer: Self-pay | Admitting: Internal Medicine

## 2011-04-18 DIAGNOSIS — Z0000 Encounter for general adult medical examination without abnormal findings: Secondary | ICD-10-CM | POA: Insufficient documentation

## 2011-04-18 DIAGNOSIS — J45909 Unspecified asthma, uncomplicated: Secondary | ICD-10-CM | POA: Insufficient documentation

## 2011-04-18 MED ORDER — ZOSTER VACCINE LIVE 19400 UNT/0.65ML ~~LOC~~ SOLR: 0.6500 mL | Freq: Once | SUBCUTANEOUS | Status: DC

## 2011-04-18 MED ORDER — AZITHROMYCIN 250 MG PO TABS: ORAL_TABLET | ORAL | Status: AC

## 2011-04-18 NOTE — Patient Instructions (Addendum)
Rest, fluids , tylenol For cough, take Mucinex DM twice a day as needed  Take the antibiotic as prescribed, zithromax Call if no better in few days Call anytime if the symptoms are severe, you have high fever, short of breath Also, you're due for a physical exam, come back at your convenience. I am giving you a prescription for zostavax today

## 2011-04-18 NOTE — Assessment & Plan Note (Signed)
h/o IgG deficiency, bronchiectasis, sees Dr.Kuzlow Presents with a exacerbation. She is already taking prednisone. We'll add a Z-Pak. See instructions

## 2011-04-18 NOTE — Progress Notes (Signed)
  Subjective:    Patient ID: [name] Rogers, female    DOB: 08/30/1946, 65 y.o.   MRN: [id number]  HPI Symptoms the started 5 days ago, cough wheezing are above from baseline; developed some fever today. She  Already started prednisone, see phone note. Medications this is reviewed, and she is also taking DULERA (unable to add to her list)  Past Medical History  Diagnosis Date  . Menopause   . Increased blood pressure (not hypertension)     w/ cardiolite  . Diabetes mellitus   . Depression   . Anxiety   . GERD (gastroesophageal reflux disease)   . Goiter     multinodular  . Hyperlipidemia   . Asthma     h/o IgG deficiency, bronchiectasis, sees Dr.Kuzlow, previously saw Dr.Wert  . Hypertension    Past Surgical History  Procedure Date  . Thryoid ultrasound 02/24/2006  . Partial hysterectomy   . Tonsillectomy   . Eye lid surgery     cosemtic     Review of Systems SOB  at baseline, no sputum production. Some upper back pain related to cough. No nausea, vomiting, diarrhea. No sinus congestion    Objective:   Physical Exam  Constitutional: She appears well-developed and well-nourished. No distress.  HENT:  Head: Normocephalic and atraumatic.  Right Ear: External ear normal.  Left Ear: External ear normal.  Mouth/Throat: No oropharyngeal exudate.       Face is not tender to palpation  Cardiovascular: Normal rate, regular rhythm and normal heart sounds.   Pulmonary/Chest:       No increased work of breathing, bilateral rhonchi, few end  expiratory wheezing bilaterally as well.  Musculoskeletal: She exhibits no edema.  Skin: She is not diaphoretic.          Assessment & Plan:

## 2011-04-18 NOTE — Assessment & Plan Note (Signed)
Due for a physical exam, interested on a shingles shot. See prescription and instructions.

## 2011-04-19 NOTE — Op Note (Signed)
  [name], [name] Rogers                 ACCOUNT NO.:  [id number]  MEDICAL RECORD NO.:  [id number]          PATIENT TYPE:  LOCATION:                                 FACILITY:  PHYSICIAN:  Chucky May, M.D.  DATE OF BIRTH:  1946-01-14  DATE OF PROCEDURE:  12/05/2010 DATE OF DISCHARGE:                              OPERATIVE REPORT   ANESTHESIA:  MAC.  The outpatient facility was appropriate setting for this surgery.  SURGEON:  Chucky May, MD  PREOPERATIVE DIAGNOSIS:  Cataract, right eye.  POSTOPERATIVE DIAGNOSIS:  Cataract, right eye.  OPERATION PERFORMED:  Cataract extraction with intraocular lens implant with pretreatment with the LenSx laser.  INDICATIONS FOR SURGERY:  The patient is a 65 year old female with painless progressive decrease in vision, so she has difficulty for activities of daily living including reading and driving.  PROCEDURE IN DETAIL:  The patient initially was taken to the LenSx Laser Room where an anterior capsulorrhexis was performed, followed by emulsification of the nucleus.  She was then taken to the main operating room where she was prepped and draped in the usual manner.  Lid speculum was instilled and cornea was irrigated with 4% lidocaine drops.  A micro keratome of 2.4 mm was used to enter the anterior chamber temporally, followed by an additional port at 12 o'clock of approximately 1 mm diameter.  Viscoat was instilled into the anterior chamber and the previously performed capsulorrhexis flap was removed without difficulty. The nucleus was mobilized by hydrodissection with 1% nonpreserved lidocaine.  The nucleus was then phacoemulsified without difficulty, followed by removal of residual cortical material by irrigation and aspiration.  The posterior capsule was polished.  Provisc was instilled into the capsular bag and a posterior chamber intraocular lens of 24.5 diopters power, model number [id number] was placed without  difficulty. Viscoelastic was removed by irrigation and aspiration.  The wound was checked for fluid leaks and none were noted.  The eye was dressed with topical Pred Forte and Vigamox and a Fox shield and the patient was taken to the recovery room in excellent condition where she received written and verbal instructions for postoperative care and was scheduled for followup in 24 hours.         ______________________________ Chucky May, M.D.    DJD/MEDQ  D:  12/07/2010  T:  12/07/2010  Job:  [id number]  Electronically Signed by Nelson Chimes M.D. on 04/19/2011 08:56:38 AM

## 2011-05-04 ENCOUNTER — Other Ambulatory Visit: Payer: Self-pay | Admitting: Internal Medicine

## 2011-05-06 NOTE — Telephone Encounter (Signed)
Celexa request [last refill 02/20/11 #[id number]] Please Advise.

## 2011-05-07 NOTE — Telephone Encounter (Signed)
Ok 30 and 3 RF 

## 2011-05-07 NOTE — Telephone Encounter (Signed)
Rx Done . 

## 2011-05-17 ENCOUNTER — Encounter: Payer: Self-pay | Admitting: Internal Medicine

## 2011-05-17 ENCOUNTER — Other Ambulatory Visit: Payer: Self-pay | Admitting: Internal Medicine

## 2011-05-17 ENCOUNTER — Ambulatory Visit (INDEPENDENT_AMBULATORY_CARE_PROVIDER_SITE_OTHER): Payer: Medicare Other | Admitting: Internal Medicine

## 2011-05-17 DIAGNOSIS — E039 Hypothyroidism, unspecified: Secondary | ICD-10-CM

## 2011-05-17 DIAGNOSIS — M949 Disorder of cartilage, unspecified: Secondary | ICD-10-CM

## 2011-05-17 DIAGNOSIS — E785 Hyperlipidemia, unspecified: Secondary | ICD-10-CM

## 2011-05-17 DIAGNOSIS — Z1231 Encounter for screening mammogram for malignant neoplasm of breast: Secondary | ICD-10-CM

## 2011-05-17 DIAGNOSIS — Z0000 Encounter for general adult medical examination without abnormal findings: Secondary | ICD-10-CM

## 2011-05-17 DIAGNOSIS — L304 Erythema intertrigo: Secondary | ICD-10-CM | POA: Insufficient documentation

## 2011-05-17 DIAGNOSIS — L538 Other specified erythematous conditions: Secondary | ICD-10-CM

## 2011-05-17 DIAGNOSIS — E119 Type 2 diabetes mellitus without complications: Secondary | ICD-10-CM

## 2011-05-17 DIAGNOSIS — M899 Disorder of bone, unspecified: Secondary | ICD-10-CM

## 2011-05-17 DIAGNOSIS — I10 Essential (primary) hypertension: Secondary | ICD-10-CM

## 2011-05-17 LAB — CBC WITH DIFFERENTIAL/PLATELET
Basophils Absolute: 0 10*3/uL (ref 0.0–0.1)
Eosinophils Absolute: 0 10*3/uL (ref 0.0–0.7)
Lymphocytes Relative: 28.4 % (ref 12.0–46.0)
MCHC: 33.2 g/dL (ref 30.0–36.0)
Neutrophils Relative %: 64.5 % (ref 43.0–77.0)
RBC: 4.19 Mil/uL (ref 3.87–5.11)
RDW: 14.8 % — ABNORMAL HIGH (ref 11.5–14.6)

## 2011-05-17 LAB — MICROALBUMIN / CREATININE URINE RATIO: Microalb Creat Ratio: 1 mg/g (ref 0.0–30.0)

## 2011-05-17 LAB — LIPID PANEL
HDL: 55.8 mg/dL (ref >39.00)
LDL Cholesterol: 51 mg/dL (ref 0–99)
VLDL: 26.6 mg/dL (ref 0.0–40.0)

## 2011-05-17 LAB — BASIC METABOLIC PANEL
Calcium: 8.9 mg/dL (ref 8.4–10.5)
GFR: 73.94 mL/min (ref >60.00)
Glucose, Bld: 119 mg/dL — ABNORMAL HIGH (ref 70–99)
Sodium: 141 mEq/L (ref 135–145)

## 2011-05-17 MED ORDER — CLOTRIMAZOLE-BETAMETHASONE 1-0.05 % EX CREA: TOPICAL_CREAM | Freq: Two times a day (BID) | CUTANEOUS | Status: DC

## 2011-05-17 NOTE — Telephone Encounter (Signed)
Rx Done . 

## 2011-05-17 NOTE — Assessment & Plan Note (Signed)
patient has a history of low  TSH on-and-off for years she was evaluated by endocrinology 5 -- 29 -- 07 they recommend a  ultrasound, if the ultrasound show multinodal goiter then the next step would be a nuclear medicine scan ultrasound was  done in June 2007 showed: " Unremarkable thyroid ultrasound.  Tiny benign cyst in the mid portion  of the right lobe measures 1.5 mm in greatest diameter.  No other  nodules are detected" plan:   continue monitoring TFTs, consider rereferral to endocrinology

## 2011-05-17 NOTE — Assessment & Plan Note (Addendum)
Td 03, pneumonia shot 2008, Zostavax was Rx , has not gotten it yet ; flu shot in November.  had at least 2  Cscope,  Dr Corinda Gubler, next per GI  Has not seen a gyn in years , h/o hysterectomy at age 65, after that she had several PAPs, never had an abnormal one ; married once; offered referal to gyn, pt not really interested in having more PAPs  last MMG 4-10 (-) , referal done today breast exam today (-)    Diet-exercise discused

## 2011-05-17 NOTE — Progress Notes (Signed)
Subjective:    Patient ID: [name] Rogers, female    DOB: [birth year], 65 y.o.   MRN: [id number]  HPI  Here for Medicare AWV:  1. Risk factors based on Past M, S, F history: reviewed 2. Physical Activities: sedentary life style other than home chores  3. Depression/mood:  Sx well controlled w/ citalopram 4. Hearing:   No problems noted or reported 5. ADL's:  Independent , drive  6. Fall Risk: no trecent falls, average risk 7. home Safety: does feel safe at home  8. Height, weight, &visual acuity: see VS, had a cataract done this year .wil have the other at some point  9. Counseling: provided 10. Labs ordered based on risk factors: if needed  11. Referral Coordination: if needed 12.  Care Plan, see assessment and plan  13.   Cognitive Assessment: Motor skills and cognition appropriate  In addition, today we discussed the following: RASH-- on-off, under her breast, R>L DM-- on no meds , amb CBGs ~ 120 High cholesterol--  good medication compliance, no apparent s/e  GERD-- sx well controlled , good compliance w/ PPIs Asthma-- sx well controlled , sees Dr Dallie Dad q 6 months     Past Medical History  Diagnosis Date  . Menopause     G0 P0  . Increased blood pressure (not hypertension)     w/ cardiolite  . Diabetes mellitus   . Depression   . Anxiety   . GERD (gastroesophageal reflux disease)   . Goiter     multinodular  . Hyperlipidemia   . Asthma     h/o IgG deficiency, bronchiectasis, sees Dr.Kuzlow, previously saw Dr.Wert  . Hypertension    Past Surgical History  Procedure Date  . Thryoid ultrasound 02/24/2006  . Partial hysterectomy   . Tonsillectomy   . Eye lid surgery     cosemtic  . Bronchoscopy     h/o   Family History  Problem Relation Age of Onset  . Lung cancer Father   . Colon cancer Neg Hx   . Breast cancer Neg Hx   . Diabetes Father   . Heart attack Sister 74  . Peripheral vascular disease      M s/o amputations of UE & LE     Social  History: moved from [place] aprox 2003  Married 2 kids -- adopted  Retired tobacco-- quit at age 73 ETOH-- rarely  Regular exercise--no  Review of Systems  Constitutional: Negative for fever, fatigue and unexpected weight change.  Cardiovascular: Negative for palpitations and leg swelling.  Gastrointestinal: Negative for abdominal pain, diarrhea and blood in stool.       Occ N-V w/o apparent reason, no hemoptysis  Genitourinary: Negative for dysuria, hematuria and difficulty urinating.       Objective:   Physical Exam  Constitutional: She is oriented to person, place, and time. She appears well-developed and well-nourished. No distress.  HENT:  Head: Normocephalic and atraumatic.  Neck: No thyromegaly present.  Cardiovascular: Normal rate, regular rhythm and normal heart sounds.   No murmur heard. Pulmonary/Chest: Effort normal. No respiratory distress. She has no rales.       Few ronchi and end exp wheezes noted   Abdominal: Soft. She exhibits no distension. There is no rebound.  Genitourinary:       Normal breast exam, no axillary LADs  Musculoskeletal: She exhibits no edema.  Neurological: She is alert and oriented to person, place, and time.  Skin: Skin is warm and dry. She  is not diaphoretic.  Psychiatric: She has a normal mood and affect. Her behavior is normal. Judgment and thought content normal.          Assessment & Plan:

## 2011-05-17 NOTE — Assessment & Plan Note (Signed)
No change, labs  

## 2011-05-17 NOTE — Patient Instructions (Signed)
Take calcium and vit D (600 u) daily  Get the shingles shot

## 2011-05-17 NOTE — Assessment & Plan Note (Signed)
Good tolerance and compliance w/ lipitor, labs

## 2011-05-17 NOTE — Assessment & Plan Note (Signed)
Due for labs

## 2011-05-17 NOTE — Assessment & Plan Note (Signed)
Also note that intracranial type of changes under the breasts. Plan: Lotrisone twice a day for one month, one of the areas heal, keep it dry with powder.

## 2011-05-17 NOTE — Assessment & Plan Note (Addendum)
DEXA 02-2009--- osteopenia Plan: Recheck Vit D, was low last year rec ca cit d and daily exercise  Order a DEXA

## 2011-05-18 LAB — VITAMIN D 25 HYDROXY (VIT D DEFICIENCY, FRACTURES): Vit D, 25-Hydroxy: 31 ng/mL (ref 30–89)

## 2011-05-21 ENCOUNTER — Other Ambulatory Visit: Payer: Self-pay | Admitting: Internal Medicine

## 2011-05-21 NOTE — Telephone Encounter (Signed)
Request Hydrocodone-APAP 5-500 [last refill 02/26/11]

## 2011-05-22 MED ORDER — HYDROCODONE-ACETAMINOPHEN 5-500 MG PO TABS: 1.0000 | ORAL_TABLET | ORAL | Status: DC | PRN

## 2011-05-22 NOTE — Telephone Encounter (Signed)
Rx Done . 

## 2011-05-22 NOTE — Telephone Encounter (Signed)
Ok 60, no RF 

## 2011-06-04 ENCOUNTER — Ambulatory Visit: Payer: Medicare Other

## 2011-06-18 ENCOUNTER — Ambulatory Visit
Admission: RE | Admit: 2011-06-18 | Discharge: 2011-06-18 | Disposition: A | Discharge status: Home / Self Care | Payer: Medicare Other | Source: Ambulatory Visit | Attending: Internal Medicine | Admitting: Internal Medicine

## 2011-06-18 DIAGNOSIS — M899 Disorder of bone, unspecified: Secondary | ICD-10-CM

## 2011-06-18 DIAGNOSIS — M949 Disorder of cartilage, unspecified: Secondary | ICD-10-CM

## 2011-06-18 DIAGNOSIS — Z1231 Encounter for screening mammogram for malignant neoplasm of breast: Secondary | ICD-10-CM

## 2011-07-09 ENCOUNTER — Other Ambulatory Visit: Payer: Self-pay | Admitting: Internal Medicine

## 2011-07-09 NOTE — Telephone Encounter (Signed)
Hydrocodone-APAP request [last refill 05/22/11 #[id number]]

## 2011-07-10 MED ORDER — HYDROCODONE-ACETAMINOPHEN 5-500 MG PO TABS: 1.0000 | ORAL_TABLET | ORAL | Status: DC | PRN

## 2011-07-10 NOTE — Telephone Encounter (Signed)
Ok 60, 2 RF 

## 2011-07-14 ENCOUNTER — Other Ambulatory Visit: Payer: Self-pay | Admitting: Internal Medicine

## 2011-07-27 ENCOUNTER — Other Ambulatory Visit: Payer: Self-pay | Admitting: Internal Medicine

## 2011-07-30 IMAGING — CR DG CHEST 2V
2 series · 2 of 2 positions shown · non-contrast
Comparison: 06/10/2007.

CLINICAL DATA: Nausea and vomiting.  Calf pain.

CHEST - 2 VIEW

[w chest pa]
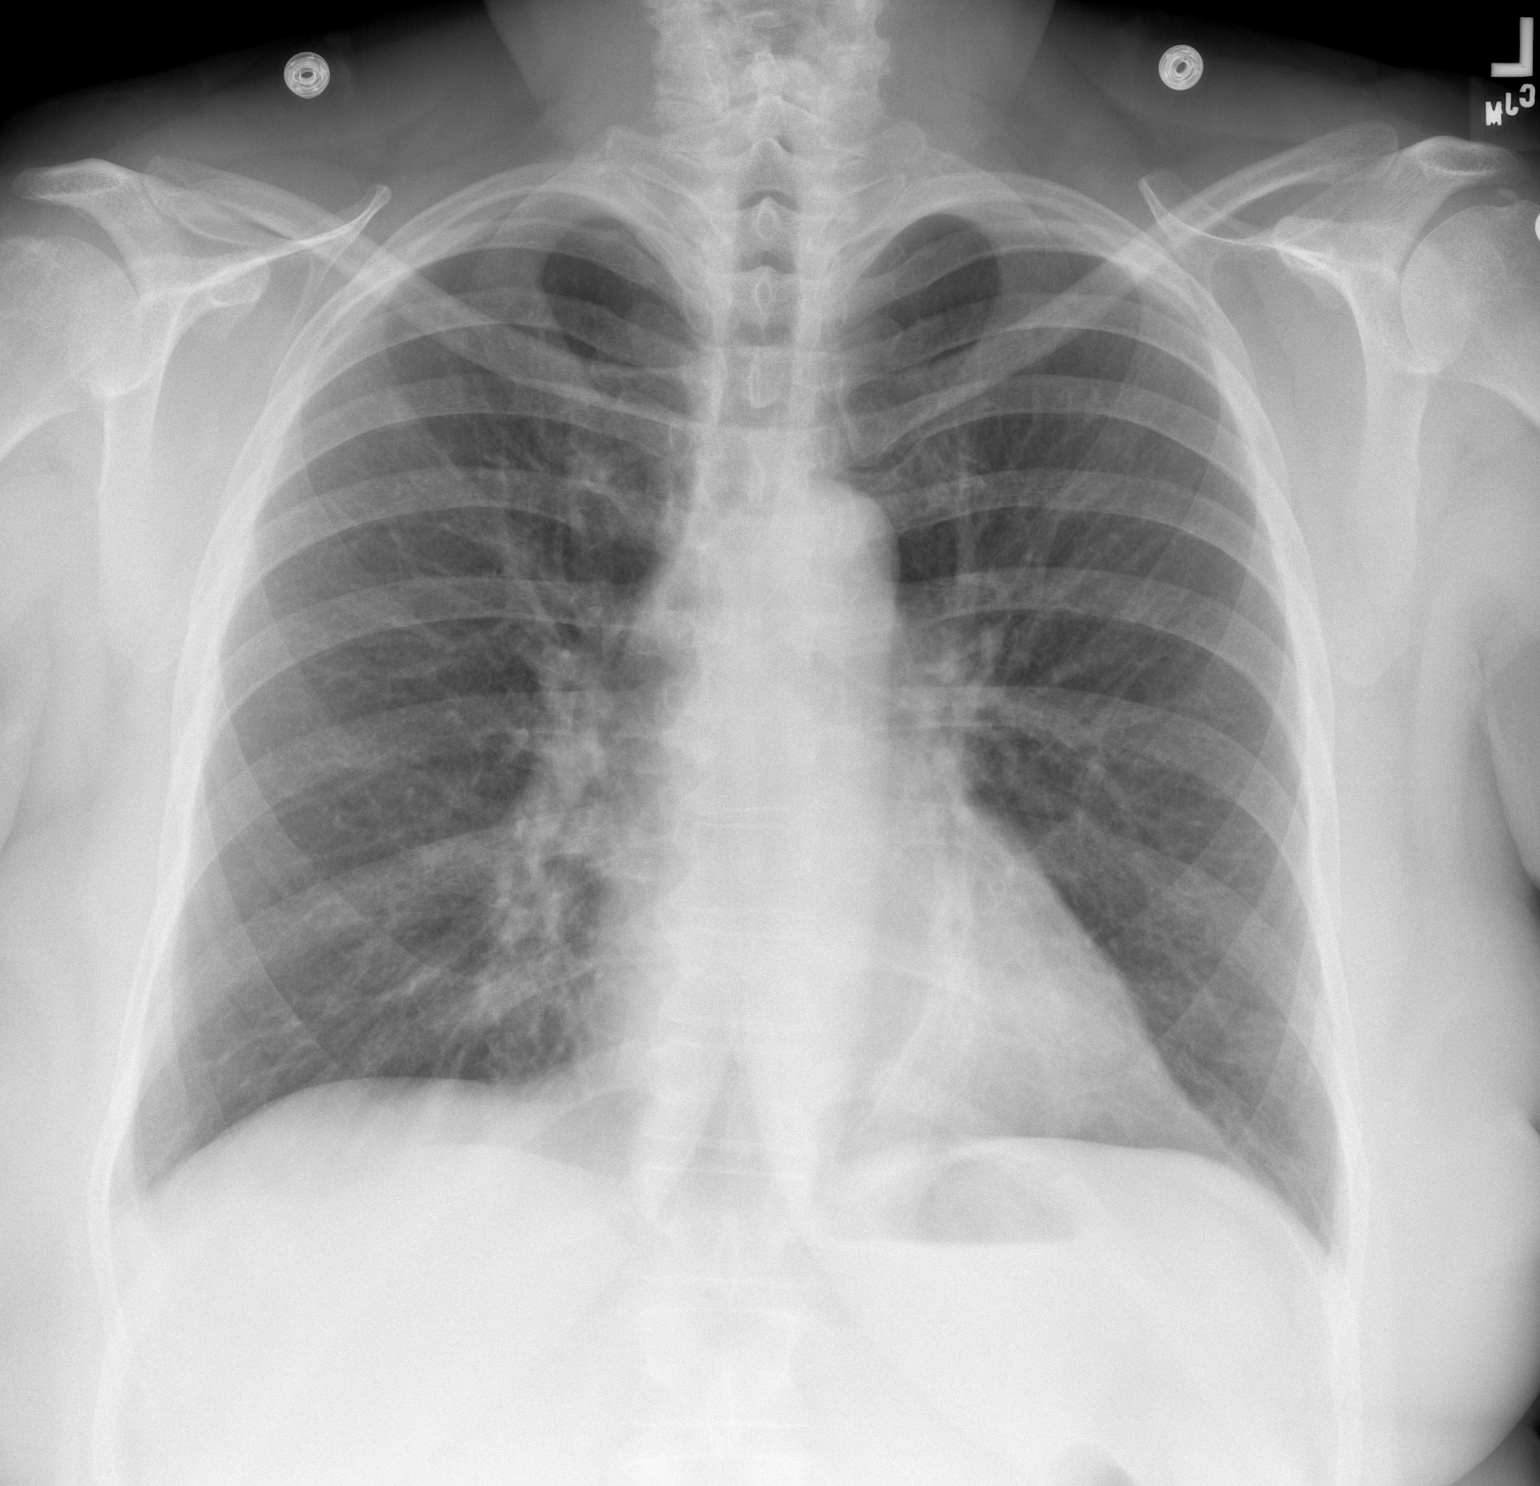

[w chest lat]
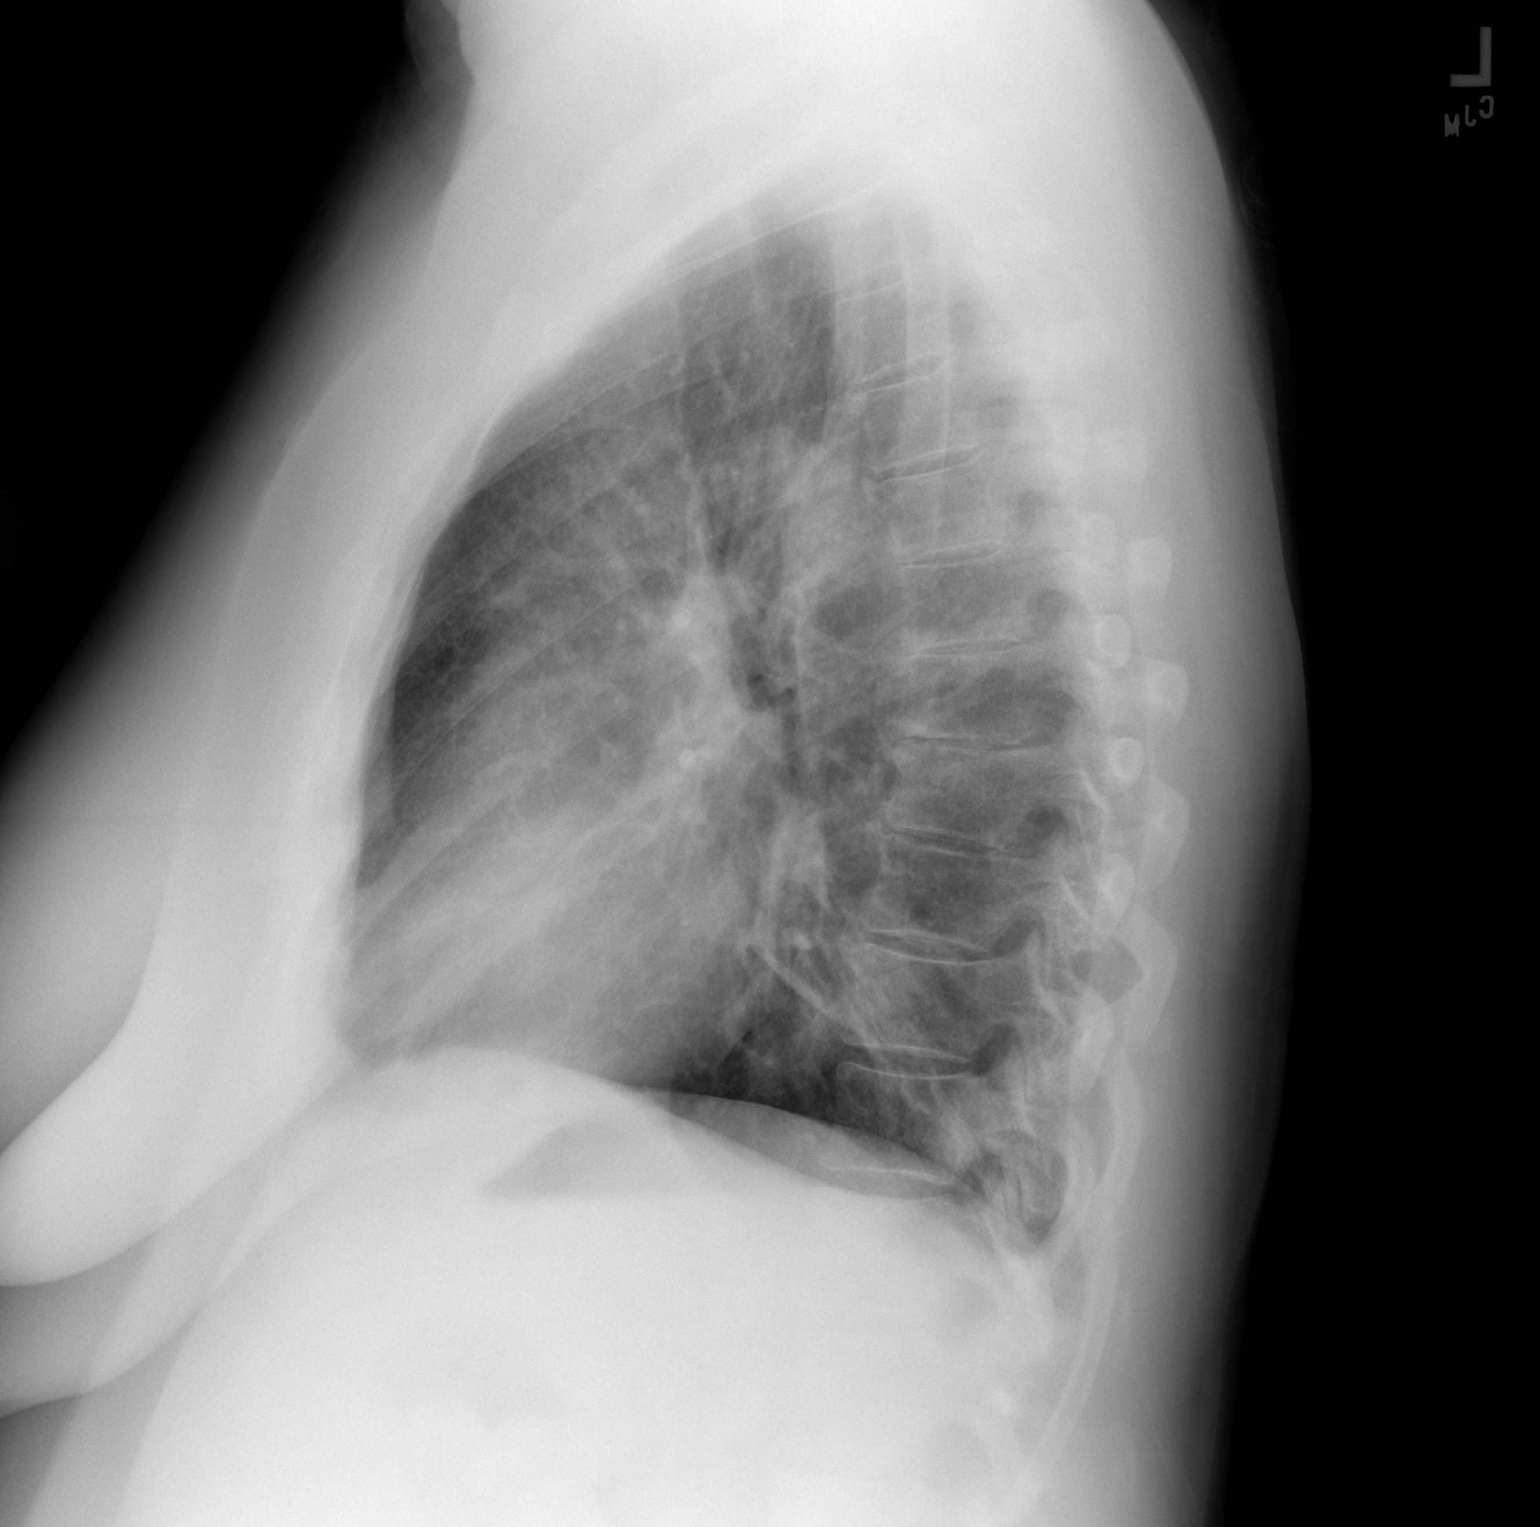

[2 of 2 positions shown; findings below may reference images not displayed]

FINDINGS: The cardiopericardial silhouette is within normal limits
for size.  Mild interstitial coarsening is chronic.  No focal
airspace disease is evident.  The visualized soft tissues and bony
thorax are unremarkable.
IMPRESSION: 1.  No acute cardiopulmonary disease or significant interval
change.
2.  Stable chronic coarsening of the interstitium in the lung bases
bilaterally.

## 2011-08-01 ENCOUNTER — Ambulatory Visit (INDEPENDENT_AMBULATORY_CARE_PROVIDER_SITE_OTHER): Payer: Medicare Other

## 2011-08-01 DIAGNOSIS — Z23 Encounter for immunization: Secondary | ICD-10-CM

## 2011-09-10 ENCOUNTER — Other Ambulatory Visit: Payer: Self-pay | Admitting: Internal Medicine

## 2011-09-10 MED ORDER — ZOLPIDEM TARTRATE ER 12.5 MG PO TBCR: 12.5000 mg | EXTENDED_RELEASE_TABLET | Freq: Every evening | ORAL | Status: DC | PRN

## 2011-09-10 NOTE — Telephone Encounter (Signed)
RX refill

## 2011-09-23 MED ORDER — ZOLPIDEM TARTRATE ER 12.5 MG PO TBCR: 12.5000 mg | EXTENDED_RELEASE_TABLET | Freq: Every evening | ORAL | Status: DC | PRN

## 2011-09-23 NOTE — Telephone Encounter (Signed)
Rx nit received---Refaxed   KP

## 2011-09-23 NOTE — Telephone Encounter (Signed)
Addended by: Arnette Norris on: 09/23/2011 02:15 PM   Modules accepted: Orders

## 2011-09-23 NOTE — Telephone Encounter (Signed)
Pt states [facility] hasn't received this RX. Please resend.

## 2011-10-12 ENCOUNTER — Other Ambulatory Visit: Payer: Self-pay | Admitting: Internal Medicine

## 2011-10-25 ENCOUNTER — Other Ambulatory Visit: Payer: Self-pay | Admitting: Internal Medicine

## 2011-10-25 NOTE — Telephone Encounter (Signed)
Refill done.  

## 2011-10-30 ENCOUNTER — Other Ambulatory Visit: Payer: Self-pay | Admitting: Internal Medicine

## 2011-10-31 NOTE — Telephone Encounter (Signed)
Refill done.  

## 2011-12-09 ENCOUNTER — Encounter: Payer: Self-pay | Admitting: Gastroenterology

## 2011-12-11 ENCOUNTER — Encounter: Payer: Self-pay | Admitting: Gastroenterology

## 2012-01-14 ENCOUNTER — Telehealth: Payer: Self-pay | Admitting: Gastroenterology

## 2012-01-14 ENCOUNTER — Ambulatory Visit (AMBULATORY_SURGERY_CENTER): Payer: Medicare Other

## 2012-01-14 VITALS — Ht 65.0 in | Wt 202.7 lb

## 2012-01-14 DIAGNOSIS — Z86010 Personal history of colonic polyps: Secondary | ICD-10-CM

## 2012-01-14 DIAGNOSIS — Z1211 Encounter for screening for malignant neoplasm of colon: Secondary | ICD-10-CM

## 2012-01-14 MED ORDER — PEG-KCL-NACL-NASULF-NA ASC-C 100 G PO SOLR: 1.0000 | Freq: Once | ORAL | Status: AC

## 2012-01-14 NOTE — Telephone Encounter (Signed)
Rebate for Moviprep mailed to pt.  Left message on pt's answering machine.

## 2012-01-19 ENCOUNTER — Other Ambulatory Visit: Payer: Self-pay | Admitting: Internal Medicine

## 2012-01-20 NOTE — Telephone Encounter (Signed)
Refill done.  

## 2012-01-27 ENCOUNTER — Other Ambulatory Visit: Payer: Self-pay | Admitting: *Deleted

## 2012-01-27 MED ORDER — ZOLPIDEM TARTRATE ER 12.5 MG PO TBCR: 12.5000 mg | EXTENDED_RELEASE_TABLET | Freq: Every evening | ORAL | Status: DC | PRN

## 2012-01-27 NOTE — Telephone Encounter (Signed)
Last filled 09-23-11 #90, last OV 05-17-11.

## 2012-01-27 NOTE — Telephone Encounter (Signed)
Call patient,  ok #30 and one refill, but needs an office visit

## 2012-01-27 NOTE — Telephone Encounter (Signed)
Left message to call office to confirm Pharmacy and advise Pt OV due.

## 2012-01-27 NOTE — Telephone Encounter (Signed)
Discuss with patient, Rx sent. 

## 2012-01-28 ENCOUNTER — Ambulatory Visit (AMBULATORY_SURGERY_CENTER): Payer: Medicare Other | Admitting: Gastroenterology

## 2012-01-28 ENCOUNTER — Encounter: Payer: Self-pay | Admitting: Gastroenterology

## 2012-01-28 VITALS — BP 183/76 | HR 84 | Temp 95.9°F | Resp 19 | Ht 65.0 in | Wt 202.0 lb

## 2012-01-28 DIAGNOSIS — Z1211 Encounter for screening for malignant neoplasm of colon: Secondary | ICD-10-CM

## 2012-01-28 DIAGNOSIS — D126 Benign neoplasm of colon, unspecified: Secondary | ICD-10-CM

## 2012-01-28 DIAGNOSIS — Z86010 Personal history of colonic polyps: Secondary | ICD-10-CM

## 2012-01-28 MED ORDER — SODIUM CHLORIDE 0.9 % IV SOLN: 500.0000 mL | INTRAVENOUS | Status: DC

## 2012-01-28 NOTE — Op Note (Signed)
[facility] [address] [place], [place]  [address]  COLONOSCOPY PROCEDURE REPORT  PATIENT:  [name], Rogers  MR#:  [id number] BIRTHDATE:  01-02-1946, 65 yrs. old  GENDER:  female ENDOSCOPIST:  Judie Petit T. Russella Dar, MD, [facility]  PROCEDURE DATE:  01/28/2012 PROCEDURE:  Colonoscopy with biopsy ASA CLASS:  Class II INDICATIONS:  1) surveillance and high-risk screening  2) history of pre-cancerous (adenomatous) colon polyps: 2003, 2008 MEDICATIONS:   MAC sedation, administered by CRNA, propofol (Diprivan) 300 mg IV DESCRIPTION OF PROCEDURE: After the risks benefits and alternatives of the procedure were thoroughly explained, informed consent was obtained.  Digital rectal exam was performed and revealed no abnormalities. The LB CF-[id number] endoscope was introduced through the anus and advanced to the cecum, which was identified by both the appendix and ileocecal valve, limited by fair prep.  The quality of the prep was Moviprep fair.  The instrument was then slowly withdrawn as the colon was fully examined.<<PROCEDUREIMAGES>> FINDINGS:  A sessile polyp was found in the distal transverse colon. It was 4 mm in size. The polyp was removed using cold biopsy forceps.  Moderate diverticulosis was found in the sigmoid colon.  Otherwise normal colonoscopy without other polyps, masses, vascular ectasias, or inflammatory changes.   Retroflexed views in the rectum revealed internal hemorrhoids, small.  The time to cecum =  4  minutes. The scope was then withdrawn (time =  10.33 min) from the patient and the procedure completed.  COMPLICATIONS:  None  ENDOSCOPIC IMPRESSION: 1) 4 mm sessile polyp in the distal transverse colon 2) Moderate diverticulosis in the sigmoid colon 3) Internal hemorrhoids  RECOMMENDATIONS: 1) Await pathology results 2) High fiber diet with liberal fluid intake. 3) Repeat Colonoscopy in 3 years with a more extensive bowel prep.  Venita Lick. Russella Dar, MD,  Clementeen Graham  n. eSIGNED:   Venita Lick. Quantez Schnyder at 01/28/2012 09:42 AM  Wyline Beady, [id number]

## 2012-01-28 NOTE — Patient Instructions (Addendum)
HIGH FIBER DIET WITH LIBERAL FLUID INTAKE.     YOU HAD AN ENDOSCOPIC PROCEDURE TODAY AT THE [facility]: Refer to the procedure report that was given to you for any specific questions about what was found during the examination.  If the procedure report does not answer your questions, please call your gastroenterologist to clarify.  If you requested that your care partner not be given the details of your procedure findings, then the procedure report has been included in a sealed envelope for you to review at your convenience later.  YOU SHOULD EXPECT: Some feelings of bloating in the abdomen. Passage of more gas than usual.  Walking can help get rid of the air that was put into your GI tract during the procedure and reduce the bloating. If you had a lower endoscopy (such as a colonoscopy or flexible sigmoidoscopy) you may notice spotting of blood in your stool or on the toilet paper. If you underwent a bowel prep for your procedure, then you may not have a normal bowel movement for a few days.  DIET: Your first meal following the procedure should be a light meal and then it is ok to progress to your normal diet.  A half-sandwich or bowl of soup is an example of a good first meal.  Heavy or fried foods are harder to digest and may make you feel nauseous or bloated.  Likewise meals heavy in dairy and vegetables can cause extra gas to form and this can also increase the bloating.  Drink plenty of fluids but you should avoid alcoholic beverages for 24 hours.  ACTIVITY: Your care partner should take you home directly after the procedure.  You should plan to take it easy, moving slowly for the rest of the day.  You can resume normal activity the day after the procedure however you should NOT DRIVE or use heavy machinery for 24 hours (because of the sedation medicines used during the test).    SYMPTOMS TO REPORT IMMEDIATELY: A gastroenterologist can be reached at any hour.  During normal business  hours, 8:30 AM to 5:00 PM Monday through Friday, call [phone].  After hours and on weekends, please call the GI answering service at [phone] who will take a message and have the physician on call contact you.   Following lower endoscopy (colonoscopy or flexible sigmoidoscopy):  Excessive amounts of blood in the stool  Significant tenderness or worsening of abdominal pains  Swelling of the abdomen that is new, acute  Fever of 100F or higher  Following upper endoscopy (EGD)  Vomiting of blood or coffee ground material  New chest pain or pain under the shoulder blades  Painful or persistently difficult swallowing  New shortness of breath  Fever of 100F or higher  Black, tarry-looking stools  FOLLOW UP: If any biopsies were taken you will be contacted by phone or by letter within the next 1-3 weeks.  Call your gastroenterologist if you have not heard about the biopsies in 3 weeks.  Our staff will call the home number listed on your records the next business day following your procedure to check on you and address any questions or concerns that you may have at that time regarding the information given to you following your procedure. This is a courtesy call and so if there is no answer at the home number and we have not heard from you through the emergency physician on call, we will assume that you have returned to your   regular daily activities without incident.  SIGNATURES/CONFIDENTIALITY: You and/or your care partner have signed paperwork which will be entered into your electronic medical record.  These signatures attest to the fact that that the information above on your After Visit Summary has been reviewed and is understood.  Full responsibility of the confidentiality of this discharge information lies with you and/or your care-partner.  

## 2012-01-28 NOTE — Progress Notes (Signed)
Patient did not experience any of the following events: a burn prior to discharge; a fall within the facility; wrong site/side/patient/procedure/implant event; or a hospital transfer or hospital admission upon discharge from the facility. (G8907) Patient did not have preoperative order for IV antibiotic SSI prophylaxis. (G8918)  

## 2012-01-29 ENCOUNTER — Telehealth: Payer: Self-pay | Admitting: *Deleted

## 2012-01-29 NOTE — Telephone Encounter (Signed)
Left message

## 2012-02-03 ENCOUNTER — Encounter: Payer: Self-pay | Admitting: Gastroenterology

## 2012-02-06 ENCOUNTER — Encounter: Payer: Self-pay | Admitting: Internal Medicine

## 2012-02-06 ENCOUNTER — Ambulatory Visit (INDEPENDENT_AMBULATORY_CARE_PROVIDER_SITE_OTHER): Payer: Medicare Other | Admitting: Internal Medicine

## 2012-02-06 VITALS — BP 140/84 | HR 82 | Temp 99.4°F | Wt 199.0 lb

## 2012-02-06 DIAGNOSIS — E0590 Thyrotoxicosis, unspecified without thyrotoxic crisis or storm: Secondary | ICD-10-CM

## 2012-02-06 DIAGNOSIS — M899 Disorder of bone, unspecified: Secondary | ICD-10-CM

## 2012-02-06 DIAGNOSIS — G4700 Insomnia, unspecified: Secondary | ICD-10-CM

## 2012-02-06 DIAGNOSIS — E119 Type 2 diabetes mellitus without complications: Secondary | ICD-10-CM

## 2012-02-06 DIAGNOSIS — L304 Erythema intertrigo: Secondary | ICD-10-CM

## 2012-02-06 DIAGNOSIS — F419 Anxiety disorder, unspecified: Secondary | ICD-10-CM

## 2012-02-06 DIAGNOSIS — F329 Major depressive disorder, single episode, unspecified: Secondary | ICD-10-CM

## 2012-02-06 DIAGNOSIS — F32A Depression, unspecified: Secondary | ICD-10-CM

## 2012-02-06 DIAGNOSIS — F341 Dysthymic disorder: Secondary | ICD-10-CM

## 2012-02-06 DIAGNOSIS — E039 Hypothyroidism, unspecified: Secondary | ICD-10-CM

## 2012-02-06 DIAGNOSIS — IMO0001 Reserved for inherently not codable concepts without codable children: Secondary | ICD-10-CM

## 2012-02-06 LAB — TSH: TSH: 0.15 u[IU]/mL — ABNORMAL LOW (ref 0.35–5.50)

## 2012-02-06 LAB — HEMOGLOBIN A1C: Hgb A1c MFr Bld: 6.6 % — ABNORMAL HIGH (ref 4.6–6.5)

## 2012-02-06 LAB — T4, FREE: Free T4: 0.66 ng/dL (ref 0.60–1.60)

## 2012-02-06 LAB — T3, FREE: T3, Free: 2.3 pg/mL (ref 2.3–4.2)

## 2012-02-06 MED ORDER — CLOTRIMAZOLE-BETAMETHASONE 1-0.05 % EX CREA: TOPICAL_CREAM | Freq: Two times a day (BID) | CUTANEOUS | Status: AC

## 2012-02-06 MED ORDER — ZOLPIDEM TARTRATE ER 12.5 MG PO TBCR: 12.5000 mg | EXTENDED_RELEASE_TABLET | Freq: Every evening | ORAL | Status: DC | PRN

## 2012-02-06 MED ORDER — CITALOPRAM HYDROBROMIDE 20 MG PO TABS: 20.0000 mg | ORAL_TABLET | Freq: Two times a day (BID) | ORAL | Status: DC

## 2012-02-06 NOTE — Assessment & Plan Note (Addendum)
Last DEXA 9-12: mild osteopenia, stable compared to 2 years before. Recommend calcium, vitamin D and stay active

## 2012-02-06 NOTE — Assessment & Plan Note (Signed)
RF meds  

## 2012-02-06 NOTE — Assessment & Plan Note (Signed)
Diet exercise discussed, labs 

## 2012-02-06 NOTE — Assessment & Plan Note (Signed)
Well controlled 

## 2012-02-06 NOTE — Assessment & Plan Note (Signed)
RF lotrisone, keep area dry with powder

## 2012-02-06 NOTE — Assessment & Plan Note (Signed)
Followup by Dr. Sharyn Lull, doing great

## 2012-02-06 NOTE — Assessment & Plan Note (Signed)
Recheck labs 

## 2012-02-06 NOTE — Progress Notes (Signed)
  Subjective:    Patient ID: [name] Rogers, female    DOB: 10/28/1945, 66 y.o.   MRN: [id number]  HPI Routine office visit Having some knee pain, saw the  Orthopedist, go a local shot and she feels better.  Past Medical History  Diagnosis Date  . Menopause     G0 P0  . Increased blood pressure (not hypertension)     w/ cardiolite  . Diabetes mellitus   . Depression   . Anxiety   . GERD (gastroesophageal reflux disease)   . Goiter     multinodular  . Hyperlipidemia   . Asthma     h/o IgG deficiency, bronchiectasis, sees Dr.Kuzlow, previously saw Dr.Wert  . Hypertension   . Chronic cough   . Cataract   . Sciatica of right side   . Wheezing    Past Surgical History  Procedure Date  . Thryoid ultrasound 02/24/2006  . Partial hysterectomy   . Tonsillectomy   . Eye lid surgery     cosemtic  . Bronchoscopy     h/o  . Cataract extraction     R 2012   . Colonoscopy   . Polypectomy     Review of Systems Asthma symptoms well-controlled, sees allergist regularly High cholesterol, good medication compliance. Anxiety depression, under good control, good medication compliance. Needs a refill on Ambien which works well without apparent side effects. Chart reviewed, since the last time she was here she had a bone density test and a colonoscopy.     Objective:   Physical Exam General -- alert, well-developed, and overweight appearing. No apparent distress.  Lungs -- normal respiratory effort, no intercostal retractions, no accessory muscle use, and few ronchi.   Heart-- normal rate, regular rhythm, no murmur, and no gallop.   Extremities-- no pretibial edema bilaterally  Neurologic-- alert & oriented X3 and strength normal in all extremities. Psych-- Cognition and judgment appear intact. Alert and cooperative with normal attention span and concentration.  not anxious appearing and not depressed appearing.      Assessment & Plan:

## 2012-02-10 ENCOUNTER — Encounter: Payer: Self-pay | Admitting: *Deleted

## 2012-02-26 ENCOUNTER — Ambulatory Visit (INDEPENDENT_AMBULATORY_CARE_PROVIDER_SITE_OTHER): Payer: Medicare Other | Admitting: Internal Medicine

## 2012-02-26 ENCOUNTER — Encounter (HOSPITAL_COMMUNITY): Payer: Self-pay

## 2012-02-26 ENCOUNTER — Emergency Department (HOSPITAL_COMMUNITY)
Admission: EM | Admit: 2012-02-26 | Discharge: 2012-02-26 | Disposition: A | Discharge status: Home / Self Care | Payer: Medicare Other | Attending: Emergency Medicine | Admitting: Emergency Medicine

## 2012-02-26 VITALS — BP 138/56 | HR 80 | Temp 98.8°F | Wt 201.0 lb

## 2012-02-26 DIAGNOSIS — T148XXA Other injury of unspecified body region, initial encounter: Secondary | ICD-10-CM

## 2012-02-26 DIAGNOSIS — S81009A Unspecified open wound, unspecified knee, initial encounter: Secondary | ICD-10-CM | POA: Insufficient documentation

## 2012-02-26 DIAGNOSIS — Y998 Other external cause status: Secondary | ICD-10-CM | POA: Insufficient documentation

## 2012-02-26 DIAGNOSIS — Z87891 Personal history of nicotine dependence: Secondary | ICD-10-CM | POA: Insufficient documentation

## 2012-02-26 DIAGNOSIS — IMO0002 Reserved for concepts with insufficient information to code with codable children: Secondary | ICD-10-CM

## 2012-02-26 DIAGNOSIS — Z23 Encounter for immunization: Secondary | ICD-10-CM

## 2012-02-26 DIAGNOSIS — Y9389 Activity, other specified: Secondary | ICD-10-CM | POA: Insufficient documentation

## 2012-02-26 DIAGNOSIS — Z79899 Other long term (current) drug therapy: Secondary | ICD-10-CM | POA: Insufficient documentation

## 2012-02-26 DIAGNOSIS — Y92009 Unspecified place in unspecified non-institutional (private) residence as the place of occurrence of the external cause: Secondary | ICD-10-CM | POA: Insufficient documentation

## 2012-02-26 DIAGNOSIS — W268XXA Contact with other sharp object(s), not elsewhere classified, initial encounter: Secondary | ICD-10-CM | POA: Insufficient documentation

## 2012-02-26 DIAGNOSIS — M5430 Sciatica, unspecified side: Secondary | ICD-10-CM | POA: Insufficient documentation

## 2012-02-26 DIAGNOSIS — I10 Essential (primary) hypertension: Secondary | ICD-10-CM | POA: Insufficient documentation

## 2012-02-26 DIAGNOSIS — E785 Hyperlipidemia, unspecified: Secondary | ICD-10-CM | POA: Insufficient documentation

## 2012-02-26 DIAGNOSIS — E119 Type 2 diabetes mellitus without complications: Secondary | ICD-10-CM | POA: Insufficient documentation

## 2012-02-26 DIAGNOSIS — K219 Gastro-esophageal reflux disease without esophagitis: Secondary | ICD-10-CM | POA: Insufficient documentation

## 2012-02-26 NOTE — Progress Notes (Signed)
  Subjective:    Patient ID: [name] Rogers, female    DOB: [birth year], 66 y.o.   MRN: [id number]  HPI Patient walked in, she fell 30 minutes ago and has a laceration @ the left pretibial area. No other injuries    Review of Systems     Objective:   Physical Exam Alert oriented, no apparent distress And the left pretibial area she has a V-shaped deep wound, bone exposure?Marland Kitchen No active bleeding        Assessment & Plan:  Right pretibial area wound, area was cleaned with saline. She probably needs a double layer suture. Will send her to the ER at [facility], Advise her to go now, states she will  Tetanus shot provided. No charge for the visit.

## 2012-02-26 NOTE — Patient Instructions (Signed)
Please go today [name] ER for possible suture off the wound

## 2012-02-26 NOTE — ED Notes (Signed)
Patient states she ran into a metal bed and obtained a laceration to her left shin area. Patient went to her PCP for advice on sutures. Patient was told to come to the ED. Laceration is approx. 1 inch. Bleeding controlled.

## 2012-02-26 NOTE — ED Provider Notes (Signed)
[name] Rogers is a 66 y.o. female who accidentally bumped her leg on a bed. She c/o only of left leg laceration. She is A&O X 3. Left shin with distal based, scafing wound, sutured by NPP. She is N/V and motion intact distally.    Medical screening examination/treatment/procedure(s) were conducted as a shared visit with non-physician practitioner(s) and myself.  I personally evaluated the patient during the encounter  Flint Melter, MD 02/27/12 [phone]

## 2012-02-26 NOTE — ED Provider Notes (Signed)
History     CSN: [id number]  Arrival date & time 02/26/12  1328   First MD Initiated Contact with Patient 02/26/12 1520      Chief Complaint  Patient presents with  . Laceration    (Consider location/radiation/quality/duration/timing/severity/associated sxs/prior treatment) HPI Comments: Patient reports that in the process of moving at home she ran into a metal bed frame, causing a laceration of her left shin. Denies any breaking of the metal.  Denies other injury.  Was seen by her PCP and given tetanus vx, sent to ED for repair.    Patient is a 66 y.o. female presenting with skin laceration. The history is provided by the patient.  Laceration     Past Medical History  Diagnosis Date  . Menopause     G0 P0  . Increased blood pressure (not hypertension)     w/ cardiolite  . Diabetes mellitus   . Depression   . Anxiety   . GERD (gastroesophageal reflux disease)   . Goiter     multinodular  . Hyperlipidemia   . Asthma     h/o IgG deficiency, bronchiectasis, sees Dr.Kuzlow, previously saw Dr.Wert  . Hypertension   . Chronic cough   . Cataract   . Sciatica of right side   . Wheezing     Past Surgical History  Procedure Date  . Thryoid ultrasound 02/24/2006  . Partial hysterectomy   . Tonsillectomy   . Eye lid surgery     cosemtic  . Bronchoscopy     h/o  . Cataract extraction     R 2012   . Colonoscopy   . Polypectomy     Family History  Problem Relation Age of Onset  . Lung cancer Father   . Diabetes Father   . Colon cancer Neg Hx   . Breast cancer Neg Hx   . Heart attack Sister 57  . Peripheral vascular disease      M s/o amputations of UE & LE    History  Substance Use Topics  . Smoking status: Former Smoker    Types: Cigarettes    Quit date: 01/13/1974  . Smokeless tobacco: Never Used  . Alcohol Use: No     rarely    OB History    Grav Para Term Preterm Abortions TAB SAB Ect Mult Living                  Review of Systems    Constitutional: Negative for fever and chills.  Respiratory: Positive for cough.   Cardiovascular: Negative for leg swelling.  Musculoskeletal: Negative for arthralgias.  Skin: Positive for wound. Negative for color change and pallor.  Neurological: Negative for weakness and numbness.    Allergies  Review of patient's allergies indicates no known allergies.  Home Medications   Current Outpatient Rx  Name Route Sig Dispense Refill  . ALBUTEROL SULFATE HFA 108 (90 BASE) MCG/ACT IN AERS Inhalation Inhale 2 puffs into the lungs every 4 (four) hours as needed.      . ATORVASTATIN CALCIUM 80 MG PO TABS  TAKE 1 TABLET ONCE DAILY 90 tablet 0  . BECLOMETHASONE DIPROPIONATE 80 MCG/ACT IN AERS Inhalation Inhale 2 puffs into the lungs at bedtime.      Marland Kitchen HYDROCOD POLST-CPM POLST ER 10-8 MG/5ML PO LQCR Oral Take 5 mLs by mouth.      Marland Kitchen CITALOPRAM HYDROBROMIDE 20 MG PO TABS Oral Take 1 tablet (20 mg total) by mouth 2 (two) times daily. 180  tablet 2  . CLOTRIMAZOLE-BETAMETHASONE 1-0.05 % EX CREA Topical Apply topically 2 (two) times daily. 90 g 3  . FLUTICASONE PROPIONATE 50 MCG/ACT NA SUSP Nasal 1 spray by Nasal route 2 (two) times daily.      . GUAIFENESIN ER 600 MG PO TB12 Oral Take 1,200 mg by mouth as needed.    . IPRATROPIUM BROMIDE 0.02 % IN SOLN Nebulization Take 500 mcg by nebulization 4 (four) times daily.      . MOMETASONE FURO-FORMOTEROL FUM 200-5 MCG/ACT IN AERO Inhalation Inhale 2 puffs into the lungs 2 (two) times daily.      Marland Kitchen MONTELUKAST SODIUM 10 MG PO TABS Oral Take 10 mg by mouth at bedtime.      . OMEPRAZOLE 20 MG PO CPDR  TAKE 1 CAPSULE TWICE A DAY 180 capsule 1  . VERAPAMIL HCL ER 180 MG PO TBCR  TAKE 1 TABLET ONCE DAILY 90 tablet 2  . ZOLPIDEM TARTRATE ER 12.5 MG PO TBCR Oral Take 1 tablet (12.5 mg total) by mouth at bedtime as needed for sleep. 30 tablet 0    BP 138/59  Pulse 81  Temp(Src) 98.4 F (36.9 C) (Oral)  Resp 19  SpO2 96%  Physical Exam  Nursing note and  vitals reviewed. Constitutional: She is oriented to person, place, and time. She appears well-developed and well-nourished.  HENT:  Head: Normocephalic and atraumatic.  Neck: Neck supple.  Pulmonary/Chest: Effort normal.  Musculoskeletal:       Left lower extremity: strength 5/5, sensation intact, distal pulses intact.   Neurological: She is alert and oriented to person, place, and time.  Skin:       ED Course  Procedures (including critical care time)  Labs Reviewed - No data to display No results found. LACERATION REPAIR Performed by: Rise Patience Consent: Verbal consent obtained. Risks and benefits: risks, benefits and alternatives were discussed Patient identity confirmed: provided demographic data Time out performed prior to procedure Prepped and Draped in normal sterile fashion Wound explored  Laceration Location: left shin  Laceration Length: 8 cm  No Foreign Bodies seen or palpated  Anesthesia: local infiltration  Local anesthetic: lidocaine 2% with epinephrine around edges, not on flap  Anesthetic total: 5 ml  Irrigation method: syringe Amount of cleaning: standard  Skin closure: 4-0 prolene  Number of sutures or staples: 6  Technique: simple interrupted  Patient tolerance: Patient tolerated the procedure well with no immediate complications.   4:00 PM Patient also seen by Dr Effie Shy.   1. Laceration       MDM  Patient with laceration to left shin, repaired in ED.  Neurovascularly intact.  Tetanus updated today at PCP office.  Wound care and return precautions discussed at length.  Patient advised of the thin flap and possibility of difficulty healing, also advised that she will have a scar.  Patient verbalizes understanding and agrees with plan.          Dillard Cannon [place], [place] 02/26/12 1651

## 2012-02-26 NOTE — Discharge Instructions (Signed)
Read the information below.  Please keep the current bandage on for the next 36-48 hours.  Gently wash the wound gently with soap and water daily and keep it covered with antibiotic ointment (e.g. Bacitracin, Neosporin, etc) and a bandage until it heals.  Do not soak the wound.  As we discussed, this may be a difficulty wound to heal and you should watch it closely. Please have your doctor or the ER check it if it turns very dark or very pale. Return to the ER immediately if you develop redness, swelling, pus draining from the wound, or fevers greater than 100.4.  Your sutures will stay in for 14 days - you may have your primary care provider, the urgent care, or the ER check them at that time.  You may return to the ER at any time for worsening condition or any new symptoms that concern you.  Laceration Care, Adult A laceration is a cut or lesion that goes through all layers of the skin and into the tissue just beneath the skin. TREATMENT  Some lacerations may not require closure. Some lacerations may not be able to be closed due to an increased risk of infection. It is important to see your caregiver as soon as possible after an injury to minimize the risk of infection and maximize the opportunity for successful closure. If closure is appropriate, pain medicines may be given, if needed. The wound will be cleaned to help prevent infection. Your caregiver will use stitches (sutures), staples, wound glue (adhesive), or skin adhesive strips to repair the laceration. These tools bring the skin edges together to allow for faster healing and a better cosmetic outcome. However, all wounds will heal with a scar. Once the wound has healed, scarring can be minimized by covering the wound with sunscreen during the day for 1 full year. HOME CARE INSTRUCTIONS  For sutures or staples:  Keep the wound clean and dry.   If you were given a bandage (dressing), you should change it at least once a day. Also, change the  dressing if it becomes wet or dirty, or as directed by your caregiver.   Wash the wound with soap and water 2 times a day. Rinse the wound off with water to remove all soap. Pat the wound dry with a clean towel.   After cleaning, apply a thin layer of the antibiotic ointment as recommended by your caregiver. This will help prevent infection and keep the dressing from sticking.   You may shower as usual after the first 24 hours. Do not soak the wound in water until the sutures are removed.   Only take over-the-counter or prescription medicines for pain, discomfort, or fever as directed by your caregiver.   Get your sutures or staples removed as directed by your caregiver.  For skin adhesive strips:  Keep the wound clean and dry.   Do not get the skin adhesive strips wet. You may bathe carefully, using caution to keep the wound dry.   If the wound gets wet, pat it dry with a clean towel.   Skin adhesive strips will fall off on their own. You may trim the strips as the wound heals. Do not remove skin adhesive strips that are still stuck to the wound. They will fall off in time.  For wound adhesive:  You may briefly wet your wound in the shower or bath. Do not soak or scrub the wound. Do not swim. Avoid periods of heavy perspiration until the skin  adhesive has fallen off on its own. After showering or bathing, gently pat the wound dry with a clean towel.   Do not apply liquid medicine, cream medicine, or ointment medicine to your wound while the skin adhesive is in place. This may loosen the film before your wound is healed.   If a dressing is placed over the wound, be careful not to apply tape directly over the skin adhesive. This may cause the adhesive to be pulled off before the wound is healed.   Avoid prolonged exposure to sunlight or tanning lamps while the skin adhesive is in place. Exposure to ultraviolet light in the first year will darken the scar.   The skin adhesive will usually  remain in place for 5 to 10 days, then naturally fall off the skin. Do not pick at the adhesive film.  You may need a tetanus shot if:  You cannot remember when you had your last tetanus shot.   You have never had a tetanus shot.  If you get a tetanus shot, your arm may swell, get red, and feel warm to the touch. This is common and not a problem. If you need a tetanus shot and you choose not to have one, there is a rare chance of getting tetanus. Sickness from tetanus can be serious. SEEK MEDICAL CARE IF:   You have redness, swelling, or increasing pain in the wound.   You see a red line that goes away from the wound.   You have yellowish-white fluid (pus) coming from the wound.   You have a fever.   You notice a bad smell coming from the wound or dressing.   Your wound breaks open before or after sutures have been removed.   You notice something coming out of the wound such as wood or glass.   Your wound is on your hand or foot and you cannot move a finger or toe.  SEEK IMMEDIATE MEDICAL CARE IF:   Your pain is not controlled with prescribed medicine.   You have severe swelling around the wound causing pain and numbness or a change in color in your arm, hand, leg, or foot.   Your wound splits open and starts bleeding.   You have worsening numbness, weakness, or loss of function of any joint around or beyond the wound.   You develop painful lumps near the wound or on the skin anywhere on your body.  MAKE SURE YOU:   Understand these instructions.   Will watch your condition.   Will get help right away if you are not doing well or get worse.  Document Released: 09/09/2005 Document Revised: 08/29/2011 Document Reviewed: 03/05/2011 [facility] Patient Information 2012 [place], [place].

## 2012-03-11 ENCOUNTER — Ambulatory Visit (INDEPENDENT_AMBULATORY_CARE_PROVIDER_SITE_OTHER): Payer: Medicare Other | Admitting: Internal Medicine

## 2012-03-11 VITALS — BP 136/74 | HR 88 | Temp 98.7°F | Wt 197.0 lb

## 2012-03-11 DIAGNOSIS — L0291 Cutaneous abscess, unspecified: Secondary | ICD-10-CM

## 2012-03-11 DIAGNOSIS — T148XXA Other injury of unspecified body region, initial encounter: Secondary | ICD-10-CM

## 2012-03-11 DIAGNOSIS — IMO0002 Reserved for concepts with insufficient information to code with codable children: Secondary | ICD-10-CM

## 2012-03-11 DIAGNOSIS — L0390 Cellulitis, unspecified: Secondary | ICD-10-CM

## 2012-03-11 MED ORDER — DOXYCYCLINE HYCLATE 100 MG PO TABS: 100.0000 mg | ORAL_TABLET | Freq: Two times a day (BID) | ORAL | Status: AC

## 2012-03-11 NOTE — Patient Instructions (Addendum)
Keep the area clean and dry, clean with soap, call if redness increases, you see discharge or have fever Keep leg elevated

## 2012-03-11 NOTE — Progress Notes (Signed)
  Subjective:    Patient ID: [name] Rogers, female    DOB: [birth year], 66 y.o.   MRN: [id number]  HPI S/p laceration repair ~ 2 weeks ago. Needs suture removal   Review of Systems Area stil hurts, no d/c other than oozing clear fluid No fever    Objective:   Physical Exam Alert, ox3 Wound seem to be healng well, sutures are not holding anything at this time. Surrounding area alt red, no d/c but clear d/c fluid is indeed oozing. No fluctuance        Assessment & Plan:   Laceration, Suture removed Mild cellulitis? rx doxy x 1 week, see instructions  F2F ~ 18 minutes

## 2012-03-12 ENCOUNTER — Encounter: Payer: Self-pay | Admitting: Internal Medicine

## 2012-03-23 ENCOUNTER — Other Ambulatory Visit: Payer: Self-pay | Admitting: *Deleted

## 2012-03-23 MED ORDER — ZOLPIDEM TARTRATE ER 12.5 MG PO TBCR: 12.5000 mg | EXTENDED_RELEASE_TABLET | Freq: Every evening | ORAL | Status: DC | PRN

## 2012-03-23 NOTE — Telephone Encounter (Signed)
Refill done.  

## 2012-03-23 NOTE — Telephone Encounter (Signed)
Last  OV 02-26-12, last filled 02-06-12 #30. Pt requesting 90 day supply.Please advise

## 2012-03-23 NOTE — Telephone Encounter (Signed)
Ok 90, 1 RF 

## 2012-03-30 ENCOUNTER — Other Ambulatory Visit: Payer: Self-pay | Admitting: Internal Medicine

## 2012-03-30 NOTE — Telephone Encounter (Signed)
Refill done.  

## 2012-04-07 ENCOUNTER — Ambulatory Visit (INDEPENDENT_AMBULATORY_CARE_PROVIDER_SITE_OTHER): Payer: Medicare Other | Admitting: Family Medicine

## 2012-04-07 ENCOUNTER — Encounter: Payer: Self-pay | Admitting: Family Medicine

## 2012-04-07 ENCOUNTER — Other Ambulatory Visit: Payer: Self-pay | Admitting: Internal Medicine

## 2012-04-07 VITALS — BP 142/85 | HR 97 | Temp 98.4°F | Ht 65.5 in | Wt 196.0 lb

## 2012-04-07 DIAGNOSIS — B029 Zoster without complications: Secondary | ICD-10-CM | POA: Insufficient documentation

## 2012-04-07 MED ORDER — VALACYCLOVIR HCL 1 G PO TABS: 1000.0000 mg | ORAL_TABLET | Freq: Three times a day (TID) | ORAL | Status: AC

## 2012-04-07 MED ORDER — HYDROCODONE-ACETAMINOPHEN 5-500 MG PO TABS: 1.0000 | ORAL_TABLET | ORAL | Status: AC | PRN

## 2012-04-07 NOTE — Patient Instructions (Addendum)
This is shingles Start the Valtrex 3x/day for 7 days Use the Vicodin as needed for severe pain Call with any questions or concerns Hang in there!!!

## 2012-04-07 NOTE — Assessment & Plan Note (Signed)
Pt's pain and rash consistent w/ shingles.  Start Valtrex.  vicodin prn for pain.  Reviewed supportive care and red flags that should prompt return.  Pt expressed understanding and is in agreement w/ plan.

## 2012-04-07 NOTE — Progress Notes (Signed)
  Subjective:    Patient ID: [name] Rogers, female    DOB: 1946/04/08, 66 y.o.   MRN: [id number]  HPI L rib pain- sxs started 3 days ago.  Is in process of moving.  Pain is starting to improve.  Will radiate along L lower ribs and around to back.  Improves w/ Aleve and ASA.  Nothing makes pain worse.  No hx of similar.  No pain w/ breathing.  No increased SOB.  No pain in chest.  'i got poison ivy in the same spot'.   Review of Systems For ROS see HPI     Objective:   Physical Exam  Vitals reviewed. Constitutional: She appears well-developed and well-nourished. No distress.  Abdominal: Soft. Bowel sounds are normal. She exhibits no distension.  Neurological:       Pain in band like distribution corresponding to rash  Skin: Skin is warm and dry.       Linear vesicular rash on abdomen just below ribs consistent w/ shingles          Assessment & Plan:

## 2012-05-08 ENCOUNTER — Telehealth: Payer: Self-pay | Admitting: Internal Medicine

## 2012-05-08 NOTE — Telephone Encounter (Signed)
Caller: [name]/Patient; Patient Name: [name] Rogers; PCP: Willow Ora; Best Callback Phone Number: [phone].  Patient calling about cold symptoms/sinus pressure.  States onset 04/30/12 of cough and sinus pressure; onset of fever to 100.6 O 05/05/12.  Per upper respiratory infection protocol, advised and offered appointment within 24 hours.  States is away, as they are relocating to [place], and she cannot make appointment by 1630.  States she has not found new MD in [place]; requests antibiotic to be called in for her.  Did request appointment for Monday 05/11/12; appointment given, but does fall outside recommended timeframe.    Per patient request, info to office for provider review/Rx/callback.   Uses [facility] [phone].   MAY REACH PATIENT AT [phone].

## 2012-05-08 NOTE — Telephone Encounter (Signed)
Per Dr.Hopper patient to be seen at Urgent Care. I called and informed patient, patient states she will be seen on Monday.

## 2012-05-11 ENCOUNTER — Ambulatory Visit (INDEPENDENT_AMBULATORY_CARE_PROVIDER_SITE_OTHER): Payer: Medicare Other | Admitting: Family Medicine

## 2012-05-11 ENCOUNTER — Encounter: Payer: Self-pay | Admitting: Family Medicine

## 2012-05-11 VITALS — BP 132/86 | HR 75 | Temp 98.5°F | Wt 194.0 lb

## 2012-05-11 DIAGNOSIS — J40 Bronchitis, not specified as acute or chronic: Secondary | ICD-10-CM

## 2012-05-11 DIAGNOSIS — J329 Chronic sinusitis, unspecified: Secondary | ICD-10-CM

## 2012-05-11 MED ORDER — AMOXICILLIN-POT CLAVULANATE 875-125 MG PO TABS: 1.0000 | ORAL_TABLET | Freq: Two times a day (BID) | ORAL | Status: AC

## 2012-05-11 MED ORDER — METHYLPREDNISOLONE ACETATE 80 MG/ML IJ SUSP
80.0000 mg | Freq: Once | INTRAMUSCULAR | Status: AC
  Administered 2012-05-11: 80 mg via INTRAMUSCULAR

## 2012-05-11 MED ORDER — PREDNISONE 10 MG PO TABS: ORAL_TABLET | ORAL | Status: DC

## 2012-05-11 NOTE — Progress Notes (Signed)
  Subjective:     [name] Rogers is a 66 y.o. female who presents for evaluation of sinus pain. Symptoms include: congestion, cough, facial pain, fevers, nasal congestion and sinus pressure. Onset of symptoms was 1 week ago. Symptoms have been gradually worsening since that time. Past history is significant for bronchiectasis. Patient is a former smoker.  The following portions of the patient's history were reviewed and updated as appropriate: allergies, current medications, past family history, past medical history, past social history, past surgical history and problem list.  Review of Systems Pertinent items are noted in HPI.   Objective:    BP 132/86  Pulse 75  Temp 98.5 F (36.9 C) (Oral)  Wt 194 lb (87.998 kg)  SpO2 92% General appearance: alert, cooperative, appears stated age and no distress Ears: + fluid b/l ears Nose: Nares normal. Septum midline. Mucosa normal. No drainage or sinus tenderness., green discharge, moderate congestion, turbinates red, swollen, sinus tenderness bilateral Throat: lips, mucosa, and tongue normal; teeth and gums normal Neck: mild anterior cervical adenopathy, supple, symmetrical, trachea midline and thyroid not enlarged, symmetric, no tenderness/mass/nodules Lungs: rhonchi bilaterally and wheezes bilaterally Lymph nodes: Cervical adenopathy: mild, b/l    Assessment:    Acute bacterial sinusitis.   Bronchitis Plan:    Nasal steroids per medication orders. Antihistamines per medication orders. Augmentin per medication orders. f/u prn  con't inhalers , neb prn pred taper Depo medrol

## 2012-05-11 NOTE — Patient Instructions (Signed)

## 2012-06-23 ENCOUNTER — Other Ambulatory Visit: Payer: Self-pay | Admitting: Internal Medicine

## 2012-06-23 NOTE — Telephone Encounter (Signed)
Refill done.  

## 2012-06-28 ENCOUNTER — Other Ambulatory Visit: Payer: Self-pay | Admitting: Internal Medicine

## 2012-06-29 NOTE — Telephone Encounter (Signed)
Refill done.  

## 2012-07-04 ENCOUNTER — Other Ambulatory Visit: Payer: Self-pay | Admitting: Internal Medicine

## 2012-07-06 NOTE — Telephone Encounter (Signed)
Refill done.  

## 2012-09-08 ENCOUNTER — Other Ambulatory Visit: Payer: Self-pay | Admitting: *Deleted

## 2012-09-08 MED ORDER — VERAPAMIL HCL ER 180 MG PO TBCR: 180.0000 mg | EXTENDED_RELEASE_TABLET | Freq: Every day | ORAL | Status: AC

## 2012-09-08 MED ORDER — ZOLPIDEM TARTRATE ER 12.5 MG PO TBCR: 12.5000 mg | EXTENDED_RELEASE_TABLET | Freq: Every evening | ORAL | Status: DC | PRN

## 2012-09-08 NOTE — Telephone Encounter (Signed)
Refill done.  

## 2012-09-08 NOTE — Telephone Encounter (Signed)
Okay RF  one-month supply of each

## 2012-09-08 NOTE — Telephone Encounter (Signed)
Pt has pending OV for 10-02-11 and would like to get med filled to last her to OV.Please advise

## 2012-09-23 ENCOUNTER — Other Ambulatory Visit: Payer: Self-pay | Admitting: Internal Medicine

## 2012-09-24 NOTE — Telephone Encounter (Signed)
Refill done.  

## 2012-10-01 ENCOUNTER — Encounter: Payer: Medicare Other | Admitting: Internal Medicine

## 2012-10-17 IMAGING — CR DG CHEST 2V
2 series · 2 of 2 positions shown · non-contrast
Comparison: 09/14/2009

CLINICAL DATA: Preop cataract surgery

CHEST - 2 VIEW

[view not recorded (1 of 2)]
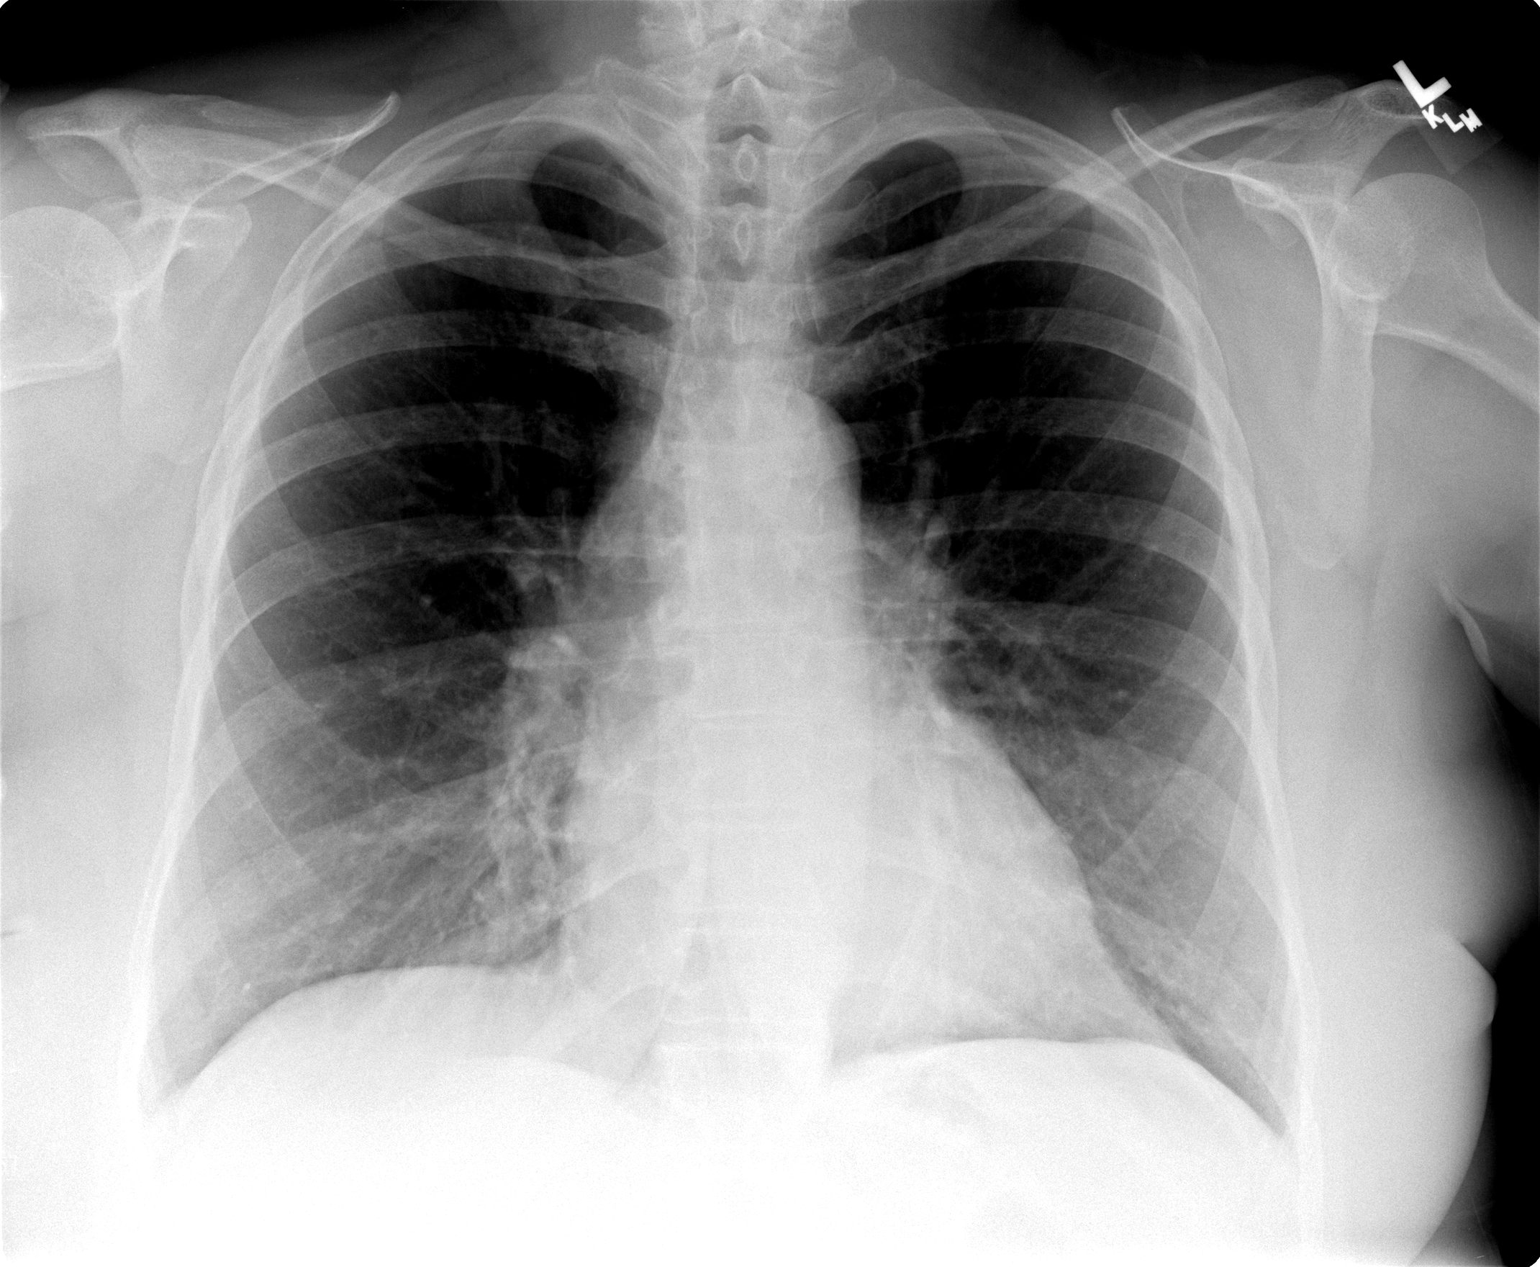

[view not recorded (2 of 2)]
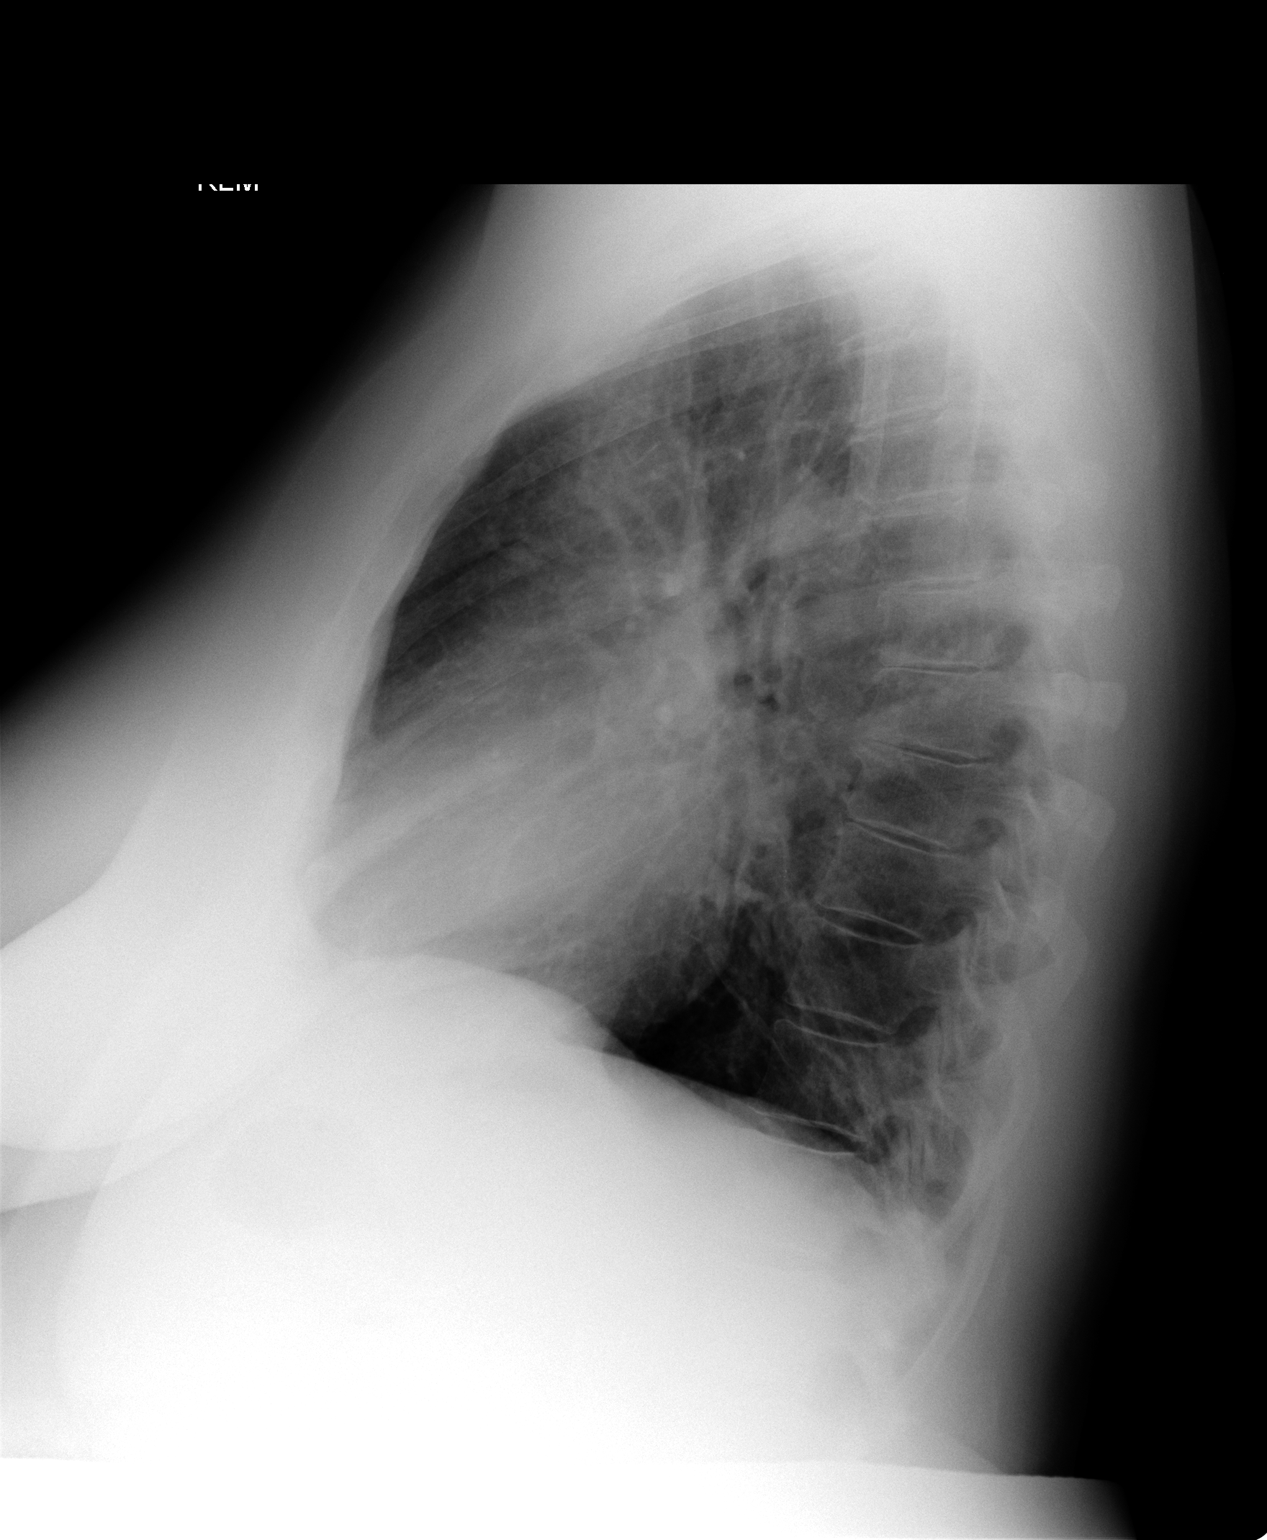

[2 of 2 positions shown; findings below may reference images not displayed]

FINDINGS: Lungs are clear. No pleural effusion or pneumothorax.

Cardiomediastinal silhouette is within normal limits.

Visualized osseous structures are within normal limits.
IMPRESSION: No evidence of acute cardiopulmonary disease.

## 2012-12-02 ENCOUNTER — Encounter: Payer: Self-pay | Admitting: Cardiology

## 2012-12-04 ENCOUNTER — Other Ambulatory Visit: Payer: Self-pay | Admitting: Internal Medicine

## 2013-03-17 ENCOUNTER — Other Ambulatory Visit: Payer: Self-pay | Admitting: Internal Medicine

## 2013-03-17 NOTE — Telephone Encounter (Signed)
Spoke to pt, she states that she has moved & is seeing another MD. Pt states she would contact her pharmacy to have them send the refill request to the correct MD.  Refill denied.

## 2014-12-26 ENCOUNTER — Encounter: Payer: Self-pay | Admitting: Gastroenterology

## 2015-06-27 ENCOUNTER — Telehealth: Payer: Self-pay

## 2015-06-27 MED ORDER — HYDROCOD POLST-CPM POLST ER 10-8 MG/5ML PO SUER: 5.0000 mL | Freq: Every day | ORAL | Status: DC

## 2015-06-27 NOTE — Telephone Encounter (Signed)
[name] Rogers is requesting a refill on Tussionex. It was last refilled on May 11, 2015. She will need the RX printed out for mail in pharmacy.

## 2015-06-27 NOTE — Telephone Encounter (Signed)
Please Refill medication

## 2015-06-27 NOTE — Telephone Encounter (Signed)
I have printed the refill rx and have it waiting on your desk for signature.

## 2015-08-07 ENCOUNTER — Other Ambulatory Visit: Payer: Self-pay

## 2015-08-07 MED ORDER — HYDROCOD POLST-CPM POLST ER 10-8 MG/5ML PO SUER: 5.0000 mL | Freq: Two times a day (BID) | ORAL | Status: DC | PRN

## 2015-08-08 ENCOUNTER — Other Ambulatory Visit: Payer: Self-pay | Admitting: Allergy and Immunology

## 2015-09-12 ENCOUNTER — Telehealth: Payer: Self-pay | Admitting: Allergy and Immunology

## 2015-09-12 NOTE — Telephone Encounter (Signed)
Needs a prescription for chlorpheniramine-HYDROcodone.  Call when its ready to be picked up.

## 2015-09-13 NOTE — Telephone Encounter (Signed)
Please refill script

## 2015-09-13 NOTE — Telephone Encounter (Signed)
O.K. TO FILL DR. Neldon Mc?  LAST FILLED 08/07/2015.  DUE FOR AN APPOINTMENT FEBRUARY 2017.

## 2015-09-14 ENCOUNTER — Other Ambulatory Visit: Payer: Self-pay | Admitting: *Deleted

## 2015-09-14 MED ORDER — HYDROCOD POLST-CPM POLST ER 10-8 MG/5ML PO SUER: ORAL | Status: DC

## 2015-09-14 NOTE — Telephone Encounter (Signed)
Patient informed that RX is ready to be picked up at the front desk.

## 2015-09-14 NOTE — Telephone Encounter (Signed)
Rx written per Dr. Neldon Mc, left message with patient to call office back

## 2015-09-14 NOTE — Telephone Encounter (Signed)
Refill Tussionex x1 per Dr. Neldon Mc.

## 2015-09-22 ENCOUNTER — Other Ambulatory Visit: Payer: Self-pay | Admitting: Allergy and Immunology

## 2015-10-25 ENCOUNTER — Other Ambulatory Visit: Payer: Self-pay | Admitting: *Deleted

## 2015-10-25 ENCOUNTER — Telehealth: Payer: Self-pay

## 2015-10-25 MED ORDER — HYDROCOD POLST-CPM POLST ER 10-8 MG/5ML PO SUER: ORAL | Status: DC

## 2015-10-25 NOTE — Telephone Encounter (Signed)
Please provide refill

## 2015-10-25 NOTE — Telephone Encounter (Signed)
REFILL PRINTED CALLED L/M SAME IS READY FOR P/U

## 2015-10-25 NOTE — Telephone Encounter (Signed)
Patient is requesting a refill on her Tussionex.  Please advise.  

## 2015-12-04 ENCOUNTER — Other Ambulatory Visit: Payer: Self-pay

## 2015-12-04 ENCOUNTER — Telehealth: Payer: Self-pay | Admitting: Allergy and Immunology

## 2015-12-04 MED ORDER — HYDROCOD POLST-CPM POLST ER 10-8 MG/5ML PO SUER: ORAL | Status: DC

## 2015-12-04 NOTE — Telephone Encounter (Signed)
Sent in refill

## 2015-12-04 NOTE — Telephone Encounter (Signed)
Needs a refill on chlorpheniramine-HYDROcodone (TUSSIONEX PENNKINETIC ER) 10-8 MG/5ML.

## 2015-12-04 NOTE — Telephone Encounter (Signed)
Please provide refill

## 2016-02-01 ENCOUNTER — Telehealth: Payer: Self-pay | Admitting: *Deleted

## 2016-02-01 NOTE — Telephone Encounter (Signed)
Pt needs medication refill for hydrocodone liquid. [facility]

## 2016-02-05 NOTE — Telephone Encounter (Signed)
Pt called asking about RX. She wants it mailed.

## 2016-02-09 ENCOUNTER — Other Ambulatory Visit: Payer: Self-pay | Admitting: *Deleted

## 2016-02-09 NOTE — Telephone Encounter (Signed)
Pt has not been seen since February 2016. Do you want to give her a refill?

## 2016-02-09 NOTE — Telephone Encounter (Signed)
Please print this out and put up front.

## 2016-02-12 ENCOUNTER — Ambulatory Visit (INDEPENDENT_AMBULATORY_CARE_PROVIDER_SITE_OTHER): Payer: Medicare Other | Admitting: Allergy and Immunology

## 2016-02-12 ENCOUNTER — Other Ambulatory Visit: Payer: Self-pay

## 2016-02-12 ENCOUNTER — Other Ambulatory Visit: Payer: Self-pay | Admitting: *Deleted

## 2016-02-12 VITALS — BP 156/84 | HR 72 | Resp 16 | Ht 64.88 in | Wt 193.6 lb

## 2016-02-12 DIAGNOSIS — J387 Other diseases of larynx: Secondary | ICD-10-CM | POA: Diagnosis not present

## 2016-02-12 DIAGNOSIS — H1010 Acute atopic conjunctivitis, unspecified eye: Secondary | ICD-10-CM | POA: Diagnosis not present

## 2016-02-12 DIAGNOSIS — J4551 Severe persistent asthma with (acute) exacerbation: Secondary | ICD-10-CM

## 2016-02-12 DIAGNOSIS — K219 Gastro-esophageal reflux disease without esophagitis: Secondary | ICD-10-CM

## 2016-02-12 DIAGNOSIS — J309 Allergic rhinitis, unspecified: Secondary | ICD-10-CM | POA: Diagnosis not present

## 2016-02-12 MED ORDER — DULERA 200-5 MCG/ACT IN AERO: 2.0000 | INHALATION_SPRAY | Freq: Two times a day (BID) | RESPIRATORY_TRACT | Status: DC

## 2016-02-12 MED ORDER — ALBUTEROL SULFATE HFA 108 (90 BASE) MCG/ACT IN AERS: 2.0000 | INHALATION_SPRAY | RESPIRATORY_TRACT | Status: DC | PRN

## 2016-02-12 MED ORDER — FLUTICASONE PROPIONATE 50 MCG/ACT NA SUSP: 1.0000 | Freq: Two times a day (BID) | NASAL | Status: DC

## 2016-02-12 MED ORDER — BECLOMETHASONE DIPROPIONATE 80 MCG/ACT IN AERS: 2.0000 | INHALATION_SPRAY | Freq: Every day | RESPIRATORY_TRACT | Status: DC

## 2016-02-12 MED ORDER — HYDROCOD POLST-CPM POLST ER 10-8 MG/5ML PO SUER: ORAL | Status: DC

## 2016-02-12 MED ORDER — DULERA 200-5 MCG/ACT IN AERO: INHALATION_SPRAY | RESPIRATORY_TRACT | Status: DC

## 2016-02-12 NOTE — Telephone Encounter (Signed)
Refill. Inform patient that she needs to be seen in clinic within the next 10 days

## 2016-02-12 NOTE — Progress Notes (Signed)
Follow-up Note  Referring Provider: Colon Branch, MD Primary Provider: Kathlene November, MD Date of Office Visit: 02/12/2016  Subjective:   [name] Rogers (DOB: Jun 18, 1946) is a 70 y.o. female who returns to the [facility] on 02/12/2016 in re-evaluation of the following:  HPI: [name] Rogers returns to this clinic in reevaluation of her asthma, history of chronic sinusitis, and allergic rhinitis, LPR, and history of long-standing use of narcotic cough suppressant. I last saw her in his clinic over 1 year ago.  [name] Rogers continues to have issues with cough and some wheezing for which she rarely uses a short acting bronchodilator other than at nighttime before she goes to bed. She has this pattern while consistently using Dulera and Singulair. She does use a narcotic-based cough suppressant and has done so for decades and has never increased the dose of this agent during that timeframe.  [name] Rogers has pretty good control of her upper airways. She does not have a tremendous amount of problem with recurrent infections and she is not received antibiotics while consistently using her Flonase.  Her reflux is under very good control this point in time on omeprazole 40 mg twice a day.  Since I've last seen her in his clinic she is undergone 2 knee replacements and did quite well with surgery without any significant complication with recovery after this procedure regarding her respiratory tract.    Medication List           atorvastatin 80 MG tablet  Commonly known as:  LIPITOR  TAKE 1 TABLET DAILY     beclomethasone 80 MCG/ACT inhaler  Commonly known as:  QVAR  Inhale 2 puffs into the lungs at bedtime.     chlorpheniramine-HYDROcodone 10-8 MG/5ML Suer  Commonly known as:  TUSSIONEX PENNKINETIC ER  TAKE 5 TO 7.5 MLS DAILY.     citalopram 20 MG tablet  Commonly known as:  CELEXA  TAKE 1 TABLET TWICE A DAY     DULERA 200-5 MCG/ACT Aero  Generic drug:  mometasone-formoterol  USE 2  INHALATIONS EVERY 12 HOURS TO PREVENT COUGH OR WHEEZE, RINSE, GARGLE, SPIT AFTER USE.     DULERA 200-5 MCG/ACT Aero  Generic drug:  mometasone-formoterol  Inhale 2 puffs into the lungs 2 (two) times daily.     fluticasone 50 MCG/ACT nasal spray  Commonly known as:  FLONASE  Place 1 spray into both nostrils 2 (two) times daily.     guaiFENesin 600 MG 12 hr tablet  Commonly known as:  MUCINEX  Take 1,200 mg by mouth as needed.     ipratropium 0.02 % nebulizer solution  Commonly known as:  ATROVENT  Take 500 mcg by nebulization 4 (four) times daily.     montelukast 10 MG tablet  Commonly known as:  SINGULAIR  Take 10 mg by mouth at bedtime.     omeprazole 20 MG capsule  Commonly known as:  PRILOSEC  TAKE 1 CAPSULE TWICE A DAY     VENTOLIN HFA 108 (90 Base) MCG/ACT inhaler  Generic drug:  albuterol  Inhale 2 puffs into the lungs every 4 (four) hours as needed.     verapamil 180 MG CR tablet  Commonly known as:  CALAN-SR  Take 1 tablet (180 mg total) by mouth daily.     zolpidem 12.5 MG CR tablet  Commonly known as:  AMBIEN CR  Take 1 tablet (12.5 mg total) by mouth at bedtime as needed for sleep.  zolpidem 12.5 MG CR tablet  Commonly known as:  AMBIEN CR        Past Medical History  Diagnosis Date  . Menopause     G0 P0  . Increased blood pressure (not hypertension)     w/ cardiolite  . Diabetes mellitus   . Depression   . Anxiety   . GERD (gastroesophageal reflux disease)   . Goiter     multinodular  . Hyperlipidemia   . Asthma     h/o IgG deficiency, bronchiectasis, sees Dr.Kuzlow, previously saw Dr.Wert  . Hypertension   . Chronic cough   . Cataract   . Sciatica of right side   . Wheezing     Past Surgical History  Procedure Laterality Date  . Thryoid ultrasound  02/24/2006  . Partial hysterectomy    . Tonsillectomy    . Eye lid surgery      cosemtic  . Bronchoscopy      h/o  . Cataract extraction      R 2012   . Colonoscopy    .  Polypectomy      No Known Allergies  Review of systems negative except as noted in HPI / PMHx or noted below:  Review of Systems  Constitutional: Negative.   HENT: Negative.   Eyes: Negative.   Respiratory: Negative.   Cardiovascular: Negative.   Gastrointestinal: Negative.   Genitourinary: Negative.   Musculoskeletal: Negative.   Skin: Negative.   Neurological: Negative.   Endo/Heme/Allergies: Negative.   Psychiatric/Behavioral: Negative.      Objective:   Filed Vitals:   02/12/16 1607  BP: 156/84  Pulse: 72  Resp: 16   Height: 5' 4.88" (164.8 cm)  Weight: 193 lb 9 oz (87.8 kg)   Physical Exam  Constitutional: She is well-developed, well-nourished, and in no distress.  HENT:  Head: Normocephalic.  Right Ear: Tympanic membrane, external ear and ear canal normal.  Left Ear: Tympanic membrane, external ear and ear canal normal.  Nose: Nose normal. No mucosal edema or rhinorrhea.  Mouth/Throat: Uvula is midline, oropharynx is clear and moist and mucous membranes are normal. No oropharyngeal exudate.  Eyes: Conjunctivae are normal.  Neck: Trachea normal. No tracheal tenderness present. No tracheal deviation present. No thyromegaly present.  Cardiovascular: Normal rate, regular rhythm, S1 normal, S2 normal and normal heart sounds.   No murmur heard. Pulmonary/Chest: No stridor. No respiratory distress. She has wheezes (Expiratory wheezes). She has no rales.  Musculoskeletal: She exhibits no edema.  Lymphadenopathy:       Head (right side): No tonsillar adenopathy present.       Head (left side): No tonsillar adenopathy present.    She has no cervical adenopathy.  Neurological: She is alert. Gait normal.  Skin: No rash noted. She is not diaphoretic. No erythema. Nails show no clubbing.  Psychiatric: Mood and affect normal.    Diagnostics:    Spirometry was not performed as per patient's wishes.  The patient had an Asthma Control Test with the following results:  ACT Total Score: 14.    Assessment and Plan:   1. Asthma, not well controlled, severe persistent, with acute exacerbation   2. Allergic rhinoconjunctivitis   3. LPRD (laryngopharyngeal reflux disease)     1. Nucala? Xolair?  2. Continue Dulera 200 - 2 inhalations twice a day  3. Continue Singulair 10 mg daily  4. Continue Flonase one-2 sprays each nostril once a day  5. Continue omeprazole 40 mg twice a day  6.  Continue ProAir HFA if needed  7. May use Tussionex 2.5-5.0 mL's twice a day if needed  8. Continue action plan for asthma flare including use of Qvar 80 2 inhalations twice a day added to [facility]  9. Return to clinic in 6 months or earlier if problem  [name] Rogers is stable but her stability is defined by recurrent coughing and wheezing and use of a bronchodilator before bedtime on a daily basis. I had a talk with her today about looking at other alternatives of therapy and I give her literature on nucala and Xolair and she will get back to me regarding whether or not she is interested in starting one of these medications. I have had a discussion with her in the past about using alternate forms of therapy but she is very hesitant to utilize any additional medications other than the medications that she has prescribed at this point in time. 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 She truly appears to use this agent for cough suppression and not as a recreational drug. I will see her back in this clinic in 6 months or earlier if there is a problem. She will contact me during the interval should she be interested in further exploring the options of using a biological agent to treat her atopic respiratory disease.  Allena Katz, MD [place]

## 2016-02-12 NOTE — Patient Instructions (Addendum)
  1. Nucala? Xolair?  2. Continue Dulera 200 - 2 inhalations twice a day  3. Continue Singulair 10 mg daily  4. Continue Flonase one-2 sprays each nostril once a day  5. Continue omeprazole 40 mg twice a day  6. Continue ProAir HFA if needed  7. May use Tussionex 2.5-5.0 mL's twice a day if needed  8. Continue action plan for asthma flare including use of Qvar 80 2 inhalations twice a day added to [facility]  9. Return to clinic in 6 months or earlier if problem

## 2016-02-12 NOTE — Telephone Encounter (Signed)
RX Left up front, Candice will have pt make appointment when she comes in.

## 2016-02-13 ENCOUNTER — Encounter: Payer: Self-pay | Admitting: Allergy and Immunology

## 2016-04-03 ENCOUNTER — Telehealth: Payer: Self-pay | Admitting: Allergy and Immunology

## 2016-04-03 ENCOUNTER — Other Ambulatory Visit: Payer: Self-pay

## 2016-04-03 MED ORDER — HYDROCOD POLST-CPM POLST ER 10-8 MG/5ML PO SUER: ORAL | Status: DC

## 2016-04-03 NOTE — Telephone Encounter (Signed)
Left message informing patient that Rx is ready to be picked up.

## 2016-04-03 NOTE — Telephone Encounter (Signed)
So it looks like she is using 315ml every 60 days which is 76mls per day, which is actually 2.5 mls twice a day. So we can refill a 357ml prescription

## 2016-04-03 NOTE — Telephone Encounter (Signed)
As far as I can tell from her records:  Feb 16' April 16' July 16' Aug 16' Oct 16' Dec 16' Feb 17' Mar 17' May 17 Now requesting July 17  Per your instructions, RX have all been written to dispense 300 mls with no refill.

## 2016-04-03 NOTE — Telephone Encounter (Signed)
Pt is requesting Tussionex refill.

## 2016-04-03 NOTE — Telephone Encounter (Signed)
Please review how much we are prescribing the past 12 months prior to refill.

## 2016-05-28 ENCOUNTER — Telehealth: Payer: Self-pay | Admitting: Allergy and Immunology

## 2016-05-28 NOTE — Telephone Encounter (Signed)
Needs a refill on her Hydrocodone. I did inform her it would not be ready until 06/03/2016.  Call when ready for pick-up.

## 2016-06-03 ENCOUNTER — Other Ambulatory Visit: Payer: Self-pay | Admitting: *Deleted

## 2016-06-03 MED ORDER — HYDROCOD POLST-CPM POLST ER 10-8 MG/5ML PO SUER: ORAL | 0 refills | Status: DC

## 2016-06-03 NOTE — Telephone Encounter (Signed)
Please refill patient's prescription for Tussionex using same parameters as previous refill.

## 2016-06-03 NOTE — Telephone Encounter (Signed)
Patient informed and RX left up front to be picked up.

## 2016-07-25 ENCOUNTER — Other Ambulatory Visit: Payer: Self-pay | Admitting: Allergy and Immunology

## 2016-07-25 MED ORDER — HYDROCOD POLST-CPM POLST ER 10-8 MG/5ML PO SUER: ORAL | 0 refills | Status: DC

## 2016-07-25 NOTE — Telephone Encounter (Signed)
L/M for patient advised rx ready to pick up and also last refill until appt with Dr Neldon Mc. Due this month to see him

## 2016-07-25 NOTE — Telephone Encounter (Signed)
[name] Rogers needs a refill on Hydrocodone.  Please call her and let her know when its ready for pick up.

## 2016-09-18 ENCOUNTER — Telehealth: Payer: Self-pay | Admitting: Allergy and Immunology

## 2016-09-18 NOTE — Telephone Encounter (Signed)
Patient advised needs appt before refill. Appt made

## 2016-09-18 NOTE — Telephone Encounter (Signed)
Needs a refill on her Hydrocodone.  Call when ready for pick up.

## 2016-09-25 ENCOUNTER — Other Ambulatory Visit: Payer: Self-pay | Admitting: *Deleted

## 2016-09-25 MED ORDER — HYDROCOD POLST-CPM POLST ER 10-8 MG/5ML PO SUER: ORAL | 0 refills | Status: DC

## 2016-09-26 ENCOUNTER — Ambulatory Visit: Payer: Medicare Other | Admitting: Allergy

## 2016-09-27 ENCOUNTER — Other Ambulatory Visit: Payer: Self-pay

## 2016-09-27 ENCOUNTER — Ambulatory Visit (INDEPENDENT_AMBULATORY_CARE_PROVIDER_SITE_OTHER): Payer: Medicare Other | Admitting: Allergy

## 2016-09-27 ENCOUNTER — Encounter: Payer: Self-pay | Admitting: Allergy

## 2016-09-27 VITALS — BP 130/70 | HR 80 | Resp 18

## 2016-09-27 DIAGNOSIS — J4550 Severe persistent asthma, uncomplicated: Secondary | ICD-10-CM

## 2016-09-27 DIAGNOSIS — J309 Allergic rhinitis, unspecified: Secondary | ICD-10-CM

## 2016-09-27 DIAGNOSIS — H1010 Acute atopic conjunctivitis, unspecified eye: Secondary | ICD-10-CM | POA: Diagnosis not present

## 2016-09-27 DIAGNOSIS — K219 Gastro-esophageal reflux disease without esophagitis: Secondary | ICD-10-CM

## 2016-09-27 MED ORDER — MONTELUKAST SODIUM 10 MG PO TABS: 10.0000 mg | ORAL_TABLET | Freq: Every day | ORAL | 3 refills | Status: AC

## 2016-09-27 MED ORDER — ALBUTEROL SULFATE HFA 108 (90 BASE) MCG/ACT IN AERS: 2.0000 | INHALATION_SPRAY | RESPIRATORY_TRACT | 3 refills | Status: DC | PRN

## 2016-09-27 NOTE — Patient Instructions (Addendum)
  1. Continue consideration of Nucala or Xolair as previously discussed with Dr. Neldon Mc  2. Continue Dulera 200 - 2 inhalations twice a day  3. Continue Singulair 10 mg daily  4. Continue Flonase one-2 sprays each nostril once a day  5. Continue omeprazole 40 mg twice a day  6. Continue ProAir HFA if needed  7. May use Tussionex 2.5-5.0 mL's twice a day if needed  8. Continue action plan for asthma flare including use of Qvar 80 2 inhalations twice a day added to [facility]  9. Take prednisone 20mg  x 5 days  10. Return to clinic in 6 months or earlier if problem

## 2016-09-27 NOTE — Progress Notes (Signed)
Follow-up Note  RE: [name] Rogers MRN: [id number] DOB: 02/22/1946 Date of Office Visit: 09/27/2016   History of present illness: [name] Rogers is a 71 y.o. female presenting today for follow-up of asthma, allergic rhinoconjunctivitis and reflux disease. She reports she has been doing well since this visitShe was last seen in office on 02/12/2016 by Dr. Neldon Mc.  Her asthma management she is on Dulera 200 that she takes twice a day and Singulair daily and Pro Air as needed. She also has a history of long-standing use of narcotic cough suppressants (tussionex) of which she takes 2.5 ML's twice a day.  However she has been out of this for the past 2 weeks and reports that her cough has worsened. Her Tussionex she reports keps her cough under good control. She has needed to use albuterol more mostly at night for the past 2 weeks since being off for cough suppressant.  She also reports that she takes her Qvar periodically when she feels like her breathing is worsening. She has been taking it about 2-3 times a week over the past 2 weeks. Her reflux is under good control with Prilosec that she takes twice a day. Her nasal symptoms are under good control with use of her Singulair as well as Flonase.  She has not required any antibiotics for any infections since her last visit.      Review of systems: Review of Systems  Constitutional: Negative for chills, fever and malaise/fatigue.  HENT: Negative for congestion, ear pain, nosebleeds, sinus pain and sore throat.   Eyes: Negative for discharge and redness.  Respiratory: Positive for cough, shortness of breath and wheezing.   Cardiovascular: Negative for chest pain.  Gastrointestinal: Negative for abdominal pain, heartburn, nausea and vomiting.  Skin: Negative for itching and rash.    All other systems negative unless noted above in HPI  Past medical/social/surgical/family history have been reviewed and are unchanged unless specifically  indicated below.  No changes  Medication List: Allergies as of 09/27/2016   No Known Allergies     Medication List       Accurate as of 09/27/16 11:41 AM. Always use your most recent med list.          albuterol 108 (90 Base) MCG/ACT inhaler Commonly known as:  PROAIR HFA Inhale 2 puffs into the lungs every 4 (four) hours as needed for wheezing or shortness of breath.   atorvastatin 80 MG tablet Commonly known as:  LIPITOR TAKE 1 TABLET DAILY   beclomethasone 80 MCG/ACT inhaler Commonly known as:  QVAR Inhale 2 puffs into the lungs at bedtime.   chlorpheniramine-HYDROcodone 10-8 MG/5ML Suer Commonly known as:  TUSSIONEX PENNKINETIC ER TAKE 5 TO 7.5 MLS DAILY.   citalopram 20 MG tablet Commonly known as:  CELEXA TAKE 1 TABLET TWICE A DAY   DULERA 200-5 MCG/ACT Aero Generic drug:  mometasone-formoterol Inhale 2 puffs into the lungs 2 (two) times daily.   fluticasone 50 MCG/ACT nasal spray Commonly known as:  FLONASE Place 1 spray into both nostrils 2 (two) times daily.   guaiFENesin 600 MG 12 hr tablet Commonly known as:  MUCINEX Take 1,200 mg by mouth as needed.   ipratropium 0.02 % nebulizer solution Commonly known as:  ATROVENT Take 500 mcg by nebulization 4 (four) times daily.   montelukast 10 MG tablet Commonly known as:  SINGULAIR Take 1 tablet (10 mg total) by mouth at bedtime.   omeprazole 20 MG capsule Commonly known as:  PRILOSEC TAKE 1 CAPSULE TWICE A DAY   verapamil 180 MG CR tablet Commonly known as:  CALAN-SR Take 1 tablet (180 mg total) by mouth daily.   zolpidem 12.5 MG CR tablet Commonly known as:  AMBIEN CR       Known medication allergies: No Known Allergies   Physical examination: Blood pressure 130/70, pulse 80, resp. rate 18.  General: Alert, interactive, in no acute distress. HEENT: TMs pearly gray, turbinates minimally edematous without discharge, post-pharynx non erythematous. Neck: Supple without  lymphadenopathy. Lungs: Mildly decreased breath sounds bilaterally without wheezing, rhonchi or rales. {no increased work of breathing.  Coughing throughout the encounter CV: Normal S1, S2 without murmurs. Abdomen: Nondistended, nontender. Skin: Warm and dry, without lesions or rashes. Extremities:  No clubbing, cyanosis or edema. Neuro:   Grossly intact.  Diagnositics/Labs:  Spirometry: pt refused today  Assessment and plan:   Severe persistent asthma   - Appears to be slightly exacerbated and she has been out of her narcotic cough suppressant for the past 2 weeks.  She also reports she has been around possible sick exposures.   - Discussion around biologic agents have been addressed with her at previous visits and she has been provided with formation on both the Nucala and Xolair.  Touched base with patient again regarding this and she takes that she would like to hold on starting any medication unless her symptoms worsen.   She will need lab work if biologic agent is to be pursued.  - Continue Dulera 200 - 2 inhalations twice a day  - Continue Singulair 10 mg daily  - Continue ProAir HFA if needed  - May use Tussionex 2.5-5.0 mL's twice a day if needed  - Continue action plan for asthma flare including use of Qvar 80 2 inhalations twice a day added to [facility]  - Take prednisone 20mg  x 5 days  Allergic rhinoconjunctivitis  - Continue Flonase one-2 sprays each nostril once a day  LPRD  - Continue omeprazole 40 mg twice a day   Return to clinic in 6 months or earlier if problem with Dr. Neldon Mc  I appreciate the opportunity to take part in [name] Rogers's care. Please do not hesitate to contact me with questions.  Sincerely,   Prudy Feeler, MD Allergy/Immunology [facility]

## 2016-11-12 ENCOUNTER — Other Ambulatory Visit: Payer: Self-pay | Admitting: *Deleted

## 2016-11-12 MED ORDER — HYDROCOD POLST-CPM POLST ER 10-8 MG/5ML PO SUER: ORAL | 0 refills | Status: DC

## 2017-01-23 ENCOUNTER — Telehealth: Payer: Self-pay

## 2017-01-23 MED ORDER — HYDROCOD POLST-CPM POLST ER 10-8 MG/5ML PO SUER: ORAL | 0 refills | Status: DC

## 2017-01-23 NOTE — Telephone Encounter (Signed)
Patient came to office requesting prescription for the Tussionex.  Ok per Dr. Neldon Mc.  Printed RX and gave to patient.

## 2017-02-15 ENCOUNTER — Other Ambulatory Visit: Payer: Self-pay | Admitting: Allergy and Immunology

## 2017-03-12 ENCOUNTER — Other Ambulatory Visit: Payer: Self-pay | Admitting: Allergy and Immunology

## 2017-03-12 MED ORDER — HYDROCOD POLST-CPM POLST ER 10-8 MG/5ML PO SUER: ORAL | 0 refills | Status: DC

## 2017-03-12 NOTE — Telephone Encounter (Signed)
Prescription refilled. Per Dr. Neldon Mc patient needs appointment for further refills. I left a message for patient advising it would be ready for pick up.

## 2017-03-12 NOTE — Telephone Encounter (Signed)
[name] Rogers called in and would like her prescription written out for the [facility] so she may come pick it up.

## 2017-04-30 ENCOUNTER — Encounter: Payer: Self-pay | Admitting: Allergy and Immunology

## 2017-04-30 ENCOUNTER — Ambulatory Visit (INDEPENDENT_AMBULATORY_CARE_PROVIDER_SITE_OTHER): Payer: Medicare Other | Admitting: Allergy and Immunology

## 2017-04-30 VITALS — BP 122/72 | HR 89 | Resp 18

## 2017-04-30 DIAGNOSIS — K219 Gastro-esophageal reflux disease without esophagitis: Secondary | ICD-10-CM

## 2017-04-30 DIAGNOSIS — F1190 Opioid use, unspecified, uncomplicated: Secondary | ICD-10-CM

## 2017-04-30 DIAGNOSIS — J4550 Severe persistent asthma, uncomplicated: Secondary | ICD-10-CM

## 2017-04-30 DIAGNOSIS — J3089 Other allergic rhinitis: Secondary | ICD-10-CM | POA: Diagnosis not present

## 2017-04-30 MED ORDER — BECLOMETHASONE DIPROP HFA 80 MCG/ACT IN AERB: INHALATION_SPRAY | RESPIRATORY_TRACT | 0 refills | Status: DC

## 2017-04-30 MED ORDER — DULERA 200-5 MCG/ACT IN AERO: INHALATION_SPRAY | RESPIRATORY_TRACT | 5 refills | Status: DC

## 2017-04-30 MED ORDER — IPRATROPIUM-ALBUTEROL 0.5-2.5 (3) MG/3ML IN SOLN: RESPIRATORY_TRACT | 1 refills | Status: DC

## 2017-04-30 MED ORDER — BECLOMETHASONE DIPROP HFA 80 MCG/ACT IN AERB: INHALATION_SPRAY | RESPIRATORY_TRACT | 5 refills | Status: DC

## 2017-04-30 MED ORDER — DULERA 200-5 MCG/ACT IN AERO: INHALATION_SPRAY | RESPIRATORY_TRACT | 1 refills | Status: DC

## 2017-04-30 MED ORDER — HYDROCOD POLST-CPM POLST ER 10-8 MG/5ML PO SUER: ORAL | 0 refills | Status: DC

## 2017-04-30 NOTE — Patient Instructions (Addendum)
  1. Continue consideration of Nucala or Xolair as previously discussed - check CBC w/diff, IgE, IgA/G/M  2. Continue Dulera 200 - 2 inhalations twice a day  3. Continue Singulair 10 mg daily  4. Continue Flonase one-2 sprays each nostril once a day  5. Continue omeprazole 40 mg twice a day  6. Continue ProAir HFA or DuoNeb nebulization if needed  7. May use Tussionex 2.5-5.0 mL's twice a day if needed  8. Continue action plan for asthma flare including use of Qvar 80 REDIHALER 2 inhalations twice a day added to [facility]  9. Prednisone 10mg  - 2 tablet one time per day for 5 days  10. Return to clinic in 6 months or earlier if problem  11. Obtain fall flu vaccine

## 2017-04-30 NOTE — Progress Notes (Signed)
Follow-up Note  Referring Provider: Colon Branch, MD Primary Provider: Jimmie Molly, MD Date of Office Visit: 04/30/2017  Subjective:   [name] Rogers (DOB: Sep 03, 1946) is a 71 y.o. female who returns to the [facility] [place] on 04/30/2017 in re-evaluation of the following:  HPI: Makaylia returns to this clinic in evaluation of her asthma, allergic rhinoconjunctivitis, LPR, and narcotic use. She was last seen in this clinic by Dr. Nelva Bush January 2018.  She has continued to have significant issues with recurrent coughing and wheezing and uses her bronchodilator 3 times per day which is her standard form of therapy regarding her respiratory tract disease. This occurs even though she consistently use his Dulera and Qvar twice a day.  She also has intermittent nasal congestion and intermittent anosmia and does not really use Flonase on a regular basis.  Her reflux is under pretty good control as long as she continues on Nexium twice a day. If she misses a single pill she developes very significant burning in her chest and throat. She has coffee in the morning and one or 2 Dr. peppers throughout the day and no chocolate and does not drink any alcohol.  She continues on her narcotics using Tussionex at 2.5 ML's 1 or 2 times per day. She has been out of his medication for the past 2 weeks and her cough has escalated significantly. She does not use Tussionex more than twice a day and has not escalated the dose in many years. She did not appear to develop any significant withdrawal issues while she has been out of her Tussionex for the past 2 weeks.  She did require systemic steroids during her last evaluation in January but has not required another systemic steroids since I have seen her in this clinic. She has not required an antibiotic as well.  Allergies as of 04/30/2017   No Known Allergies     Medication List      albuterol 108 (90 Base) MCG/ACT inhaler Commonly known as:   PROAIR HFA Inhale 2 puffs into the lungs every 4 (four) hours as needed for wheezing or shortness of breath.   atorvastatin 80 MG tablet Commonly known as:  LIPITOR TAKE 1 TABLET DAILY   beclomethasone 80 MCG/ACT inhaler Commonly known as:  QVAR Inhale 2 puffs into the lungs at bedtime.   chlorpheniramine-HYDROcodone 10-8 MG/5ML Suer Commonly known as:  TUSSIONEX PENNKINETIC ER TAKE 5 TO 7.5 MLS DAILY.   citalopram 20 MG tablet Commonly known as:  CELEXA TAKE 1 TABLET TWICE A DAY   DULERA 200-5 MCG/ACT Aero Generic drug:  mometasone-formoterol Inhale 2 puffs into the lungs 2 (two) times daily.   fluticasone 50 MCG/ACT nasal spray Commonly known as:  FLONASE USE ONE SPRAY INTO BOTH NOSTRILS TWICE A DAY   guaiFENesin 600 MG 12 hr tablet Commonly known as:  MUCINEX Take 1,200 mg by mouth as needed.   ipratropium 0.02 % nebulizer solution Commonly known as:  ATROVENT Take 500 mcg by nebulization 4 (four) times daily.   montelukast 10 MG tablet Commonly known as:  SINGULAIR Take 1 tablet (10 mg total) by mouth at bedtime.   omeprazole 20 MG capsule Commonly known as:  PRILOSEC TAKE 1 CAPSULE TWICE A DAY   verapamil 180 MG CR tablet Commonly known as:  CALAN-SR Take 1 tablet (180 mg total) by mouth daily.   zolpidem 12.5 MG CR tablet Commonly known as:  AMBIEN CR  Past Medical History:  Diagnosis Date  . Anxiety   . Asthma    h/o IgG deficiency, bronchiectasis, sees Dr.Kuzlow, previously saw Dr.Wert  . Cataract   . Chronic cough   . Depression   . Diabetes mellitus   . GERD (gastroesophageal reflux disease)   . Goiter    multinodular  . Hyperlipidemia   . Hypertension   . Increased blood pressure (not hypertension)    w/ cardiolite  . Menopause    G0 P0  . Sciatica of right side   . Wheezing     Past Surgical History:  Procedure Laterality Date  . BRONCHOSCOPY     h/o  . CATARACT EXTRACTION     R 2012   . COLONOSCOPY    . Eye Lid  surgery     cosemtic  . PARTIAL HYSTERECTOMY    . POLYPECTOMY    . thryoid ultrasound  02/24/2006  . TONSILLECTOMY      Review of systems negative except as noted in HPI / PMHx or noted below:  Review of Systems  Constitutional: Negative.   HENT: Negative.   Eyes: Negative.   Respiratory: Negative.   Cardiovascular: Negative.   Gastrointestinal: Negative.   Genitourinary: Negative.   Musculoskeletal: Negative.   Skin: Negative.   Neurological: Negative.   Endo/Heme/Allergies: Negative.   Psychiatric/Behavioral: Negative.      Objective:   Vitals:   04/30/17 1336  BP: 122/72  Pulse: 89  Resp: 18          Physical Exam  Constitutional: She is well-developed, well-nourished, and in no distress.  Nasal crease  HENT:  Head: Normocephalic.  Right Ear: Tympanic membrane, external ear and ear canal normal.  Left Ear: Tympanic membrane, external ear and ear canal normal.  Nose: Nose normal. No mucosal edema or rhinorrhea.  Mouth/Throat: Uvula is midline, oropharynx is clear and moist and mucous membranes are normal. No oropharyngeal exudate.  Eyes: Conjunctivae are normal.  Neck: Trachea normal. No tracheal tenderness present. No tracheal deviation present. No thyromegaly present.  Cardiovascular: Normal rate, regular rhythm, S1 normal, S2 normal and normal heart sounds.   No murmur heard. Pulmonary/Chest: No stridor. No respiratory distress. She has wheezes (bilateral expiratory wheezes in all lung fields). She has no rales.  Musculoskeletal: She exhibits no edema.  Lymphadenopathy:       Head (right side): No tonsillar adenopathy present.       Head (left side): No tonsillar adenopathy present.    She has no cervical adenopathy.  Neurological: She is alert. Gait normal.  Skin: No rash noted. She is not diaphoretic. No erythema. Nails show no clubbing.  Psychiatric: Mood and affect normal.    Diagnostics: Oxygen saturation was 91% on room air at rest   Spirometry  was performed and demonstrated an FEV1 of 2.01 at 91 % of predicted.  The patient had an Asthma Control Test with the following results: ACT Total Score: 16.    Assessment and Plan:   1. Asthma, severe persistent, well-controlled   2. Other allergic rhinitis   3. LPRD (laryngopharyngeal reflux disease)   4. Chronic narcotic use     1. Continue consideration of Nucala or Xolair as previously discussed - check CBC w/diff, IgE, IgA/G/M  2. Continue Dulera 200 - 2 inhalations twice a day  3. Continue Singulair 10 mg daily  4. Continue Flonase one-2 sprays each nostril once a day  5. Continue omeprazole 40 mg twice a day  6. Continue ProAir  HFA or DuoNeb nebulization if needed  7. May use Tussionex 2.5-5.0 mL's twice a day if needed  8. Continue action plan for asthma flare including use of Qvar 80 REDIHALER 2 inhalations twice a day added to [facility]  9. Prednisone 10mg  - 2 tablet one time per day for 5 days  10. Return to clinic in 6 months or earlier if problem  11. Obtain fall flu vaccine  Sharion will continue on high-dose inhaled steroids and a long-acting bronchodilator and a leukotriene modifier to address her lower airway atopic respiratory disease and will also continue to use therapy directed against reflux. She has been using Tussionex for several decades and she has not escalated the dose during that timeframe or used this medication inappropriately and it does appear to result in very good control regarding her cough which allows her to sleep at night. I have had a talk with her in the past about using some type of biological agent in addition to her rather significant medical plan but at this point in time she is not very interested in doing so but she is more receptive during this visit and we will check a CBC with differential and a IgE level to see if she qualifies for one of these agents. I have once again given her literature on this form of therapy during her visit  today.  Allena Katz, MD Allergy / Immunology [place]

## 2017-05-03 LAB — CBC WITH DIFFERENTIAL/PLATELET
BASOS: 0 %
Basophils Absolute: 0 10*3/uL (ref 0.0–0.2)
EOS (ABSOLUTE): 0.1 10*3/uL (ref 0.0–0.4)
EOS: 2 %
HEMATOCRIT: 40.6 % (ref 34.0–46.6)
HEMOGLOBIN: 13.7 g/dL (ref 11.1–15.9)
IMMATURE GRANULOCYTES: 0 %
Immature Grans (Abs): 0 10*3/uL (ref 0.0–0.1)
LYMPHS ABS: 1.8 10*3/uL (ref 0.7–3.1)
Lymphs: 30 %
MCH: 32.2 pg (ref 26.6–33.0)
MCHC: 33.7 g/dL (ref 31.5–35.7)
MCV: 96 fL (ref 79–97)
MONOCYTES: 5 %
MONOS ABS: 0.3 10*3/uL (ref 0.1–0.9)
Neutrophils Absolute: 3.6 10*3/uL (ref 1.4–7.0)
Neutrophils: 63 %
Platelets: 240 10*3/uL (ref 150–379)
RBC: 4.25 x10E6/uL (ref 3.77–5.28)
RDW: 14 % (ref 12.3–15.4)
WBC: 5.9 10*3/uL (ref 3.4–10.8)

## 2017-05-03 LAB — IGG, IGA, IGM
IGA/IMMUNOGLOBULIN A, SERUM: 228 mg/dL (ref 64–422)
IGM (IMMUNOGLOBULIN M), SRM: 26 mg/dL (ref 26–217)
IgG (Immunoglobin G), Serum: 688 mg/dL — ABNORMAL LOW (ref 700–1600)

## 2017-05-03 LAB — IGE: IGE (IMMUNOGLOBULIN E), SERUM: 15 [IU]/mL (ref 0–100)

## 2017-06-11 ENCOUNTER — Other Ambulatory Visit: Payer: Self-pay | Admitting: Allergy and Immunology

## 2017-06-11 MED ORDER — HYDROCOD POLST-CPM POLST ER 10-8 MG/5ML PO SUER: ORAL | 0 refills | Status: DC

## 2017-06-11 NOTE — Telephone Encounter (Signed)
Rx print and signed by Dr Neldon Mc. Patient advised she can P/U tomorrow

## 2017-06-11 NOTE — Telephone Encounter (Signed)
[name] Rogers called in and would like to pick up a prescription for chlorpheniramine-HYDROcodone and would like someone to call her and let her know if she can pick it up tomorrow.

## 2017-08-20 ENCOUNTER — Other Ambulatory Visit: Payer: Self-pay | Admitting: *Deleted

## 2017-08-20 ENCOUNTER — Telehealth: Payer: Self-pay | Admitting: Allergy and Immunology

## 2017-08-20 MED ORDER — HYDROCOD POLST-CPM POLST ER 10-8 MG/5ML PO SUER: ORAL | 0 refills | Status: DC

## 2017-08-20 NOTE — Telephone Encounter (Signed)
OK 

## 2017-08-20 NOTE — Telephone Encounter (Signed)
Please advise if OK to fill.

## 2017-08-20 NOTE — Telephone Encounter (Signed)
[name] Rogers called in and would like to pick up her prescription for [place].

## 2017-08-20 NOTE — Telephone Encounter (Signed)
Rx printed and signed, ready to be picked up.

## 2017-10-14 ENCOUNTER — Telehealth: Payer: Self-pay

## 2017-10-14 ENCOUNTER — Other Ambulatory Visit: Payer: Self-pay

## 2017-10-14 MED ORDER — HYDROCOD POLST-CPM POLST ER 10-8 MG/5ML PO SUER: ORAL | 0 refills | Status: DC

## 2017-10-14 NOTE — Telephone Encounter (Signed)
Ok that is fine to refill

## 2017-10-14 NOTE — Telephone Encounter (Signed)
Patient is requesting a refill on her Tussionex.  Please advise.

## 2017-10-14 NOTE — Telephone Encounter (Signed)
I called patient and informed her that we have the Rx ready for her to pick up.

## 2017-11-21 ENCOUNTER — Telehealth: Payer: Self-pay | Admitting: Allergy and Immunology

## 2017-11-21 ENCOUNTER — Other Ambulatory Visit: Payer: Self-pay | Admitting: *Deleted

## 2017-11-21 MED ORDER — HYDROCOD POLST-CPM POLST ER 10-8 MG/5ML PO SUER: ORAL | 0 refills | Status: DC

## 2017-11-21 NOTE — Telephone Encounter (Signed)
I will print RX and have Dr. Neldon Mc sign.

## 2017-11-21 NOTE — Telephone Encounter (Signed)
[name] Rogers called in and states she would like to pick up her prescription for [place] on Monday.

## 2017-11-21 NOTE — Telephone Encounter (Signed)
OK 

## 2017-12-01 ENCOUNTER — Other Ambulatory Visit: Payer: Self-pay | Admitting: Allergy

## 2017-12-18 ENCOUNTER — Telehealth: Payer: Self-pay | Admitting: Allergy and Immunology

## 2017-12-18 ENCOUNTER — Other Ambulatory Visit: Payer: Self-pay | Admitting: *Deleted

## 2017-12-18 MED ORDER — PREDNISONE 10 MG PO TABS: ORAL_TABLET | ORAL | 0 refills | Status: DC

## 2017-12-18 NOTE — Telephone Encounter (Signed)
[name] Rogers called in and stated she is having trouble breathing and is congested and would like to know if Dr. Raliegh Ip will call in Prednisone since she lives so far away.  She would like it called in to [facility] in [facility] on [address].

## 2017-12-18 NOTE — Telephone Encounter (Signed)
Rx sent and patient informed.  

## 2017-12-18 NOTE — Telephone Encounter (Signed)
Prednisone 10mg  - 2 tablets one time per day for 10 days

## 2018-01-12 ENCOUNTER — Other Ambulatory Visit: Payer: Self-pay | Admitting: *Deleted

## 2018-01-12 MED ORDER — HYDROCOD POLST-CPM POLST ER 10-8 MG/5ML PO SUER: ORAL | 0 refills | Status: DC

## 2018-01-12 NOTE — Telephone Encounter (Signed)
Patient called requesting refill on cough rx

## 2018-01-22 ENCOUNTER — Ambulatory Visit (INDEPENDENT_AMBULATORY_CARE_PROVIDER_SITE_OTHER): Payer: Medicare Other | Admitting: Allergy and Immunology

## 2018-01-22 ENCOUNTER — Encounter: Payer: Self-pay | Admitting: Allergy and Immunology

## 2018-01-22 VITALS — BP 118/64 | HR 80 | Resp 18

## 2018-01-22 DIAGNOSIS — K219 Gastro-esophageal reflux disease without esophagitis: Secondary | ICD-10-CM | POA: Diagnosis not present

## 2018-01-22 DIAGNOSIS — F1190 Opioid use, unspecified, uncomplicated: Secondary | ICD-10-CM | POA: Diagnosis not present

## 2018-01-22 DIAGNOSIS — J3089 Other allergic rhinitis: Secondary | ICD-10-CM | POA: Diagnosis not present

## 2018-01-22 DIAGNOSIS — J4550 Severe persistent asthma, uncomplicated: Secondary | ICD-10-CM

## 2018-01-22 MED ORDER — IPRATROPIUM-ALBUTEROL 0.5-2.5 (3) MG/3ML IN SOLN: RESPIRATORY_TRACT | 1 refills | Status: DC

## 2018-01-22 MED ORDER — OMEPRAZOLE 40 MG PO CPDR: DELAYED_RELEASE_CAPSULE | ORAL | 1 refills | Status: AC

## 2018-01-22 NOTE — Patient Instructions (Addendum)
  1. Continue Dulera 200 - 2 inhalations twice a day  2. Continue Singulair 10 mg daily  3. Continue Flonase one-2 sprays each nostril once a day  4. Increase omeprazole 40 mg twice a day  5. Continue ProAir HFA or DuoNeb nebulization if needed (new nebulizer)  6. May use Tussionex 2.5-5.0 mL's twice a day if needed  7. Continue action plan for asthma flare including use of Qvar 80 REDIHALER 2 inhalations twice a day added to [facility]  8. Return to clinic in 6 months or earlier if problem

## 2018-01-22 NOTE — Progress Notes (Signed)
Follow-up Note  Referring Provider: Jimmie Molly, MD Primary Provider: Jimmie Molly, MD Date of Office Visit: 01/22/2018  Subjective:   [name] Rogers (DOB: 03-May-1946) is a 72 y.o. female who returns to the [facility] [place] on 01/22/2018 in re-evaluation of the following:  HPI: [name] Rogers returns to this clinic in reevaluation of her asthma, allergic rhinoconjunctivitis, LPR, and chronic nighttime narcotic use for cough suppression.  Her last visit to this clinic was 30 April 2017.  Overall she has been stable.  Stable for Krystalle means that she does not need to receive a systemic steroid or antibiotic for any type of respiratory tract issue.  She still continues to have daily issues with slight cough and wheezing.  She still uses a bronchodilator at least one time per day.  This occurs even though she continues to utilize a large collection of anti-inflammatory medications for her respiratory tract.  She will occasionally add her Qvar to [facility] at points in time in which she has more coughing and wheezing.  Usually this is for a week or so and it sounds as though it is less than 1 time every 2 months.  For the most part her nose is doing relatively well.  She has had sudden onset coughing spells that she can only relieved by drinking fluid about 1 time per week.  It should be noted that her omeprazole dose has only been 20 mg twice a day and she still occasionally does develop some burning in her chest.  She has been using Tussionex at 2.5 mL's to 5 mL's at nighttime and has not really needed to use it very often during the daytime.  She did obtain the flu vaccine this year.  Allergies as of 01/22/2018   No Known Allergies     Medication List      atorvastatin 80 MG tablet Commonly known as:  LIPITOR TAKE 1 TABLET DAILY   beclomethasone 80 MCG/ACT inhaler Commonly known as:  QVAR Inhale 2 puffs into the lungs at bedtime.   beclomethasone 80 MCG/ACT  inhaler Commonly known as:  QVAR REDIHALER Inhale two doses twice daily during asthma flare-up.  Rinse, gargle, and spit after use.   chlorpheniramine-HYDROcodone 10-8 MG/5ML Suer Commonly known as:  TUSSIONEX PENNKINETIC ER Can take 2.5 to 5 ml twice daily if needed for coughing.   citalopram 20 MG tablet Commonly known as:  CELEXA TAKE 1 TABLET TWICE A DAY   DULERA 200-5 MCG/ACT Aero Generic drug:  mometasone-formoterol Inhale two puffs twice daily to prevent cough or wheeze.  Rinse, gargle, and spit after use.   fluticasone 50 MCG/ACT nasal spray Commonly known as:  FLONASE USE ONE SPRAY INTO BOTH NOSTRILS TWICE A DAY   guaiFENesin 600 MG 12 hr tablet Commonly known as:  MUCINEX Take 1,200 mg by mouth as needed.   ipratropium 0.02 % nebulizer solution Commonly known as:  ATROVENT Take 500 mcg by nebulization 4 (four) times daily.   ipratropium-albuterol 0.5-2.5 (3) MG/3ML Soln Commonly known as:  DUONEB Inhale the contents of one vial in nebulizer every four to six hours as needed for cough or wheeze.   montelukast 10 MG tablet Commonly known as:  SINGULAIR Take 1 tablet (10 mg total) by mouth at bedtime.   predniSONE 10 MG tablet Commonly known as:  DELTASONE Take 2 tablets once daily for 10 days   PROAIR HFA 108 (90 Base) MCG/ACT inhaler Generic drug:  albuterol USE 2 INHALATIONS EVERY 4 HOURS  AS NEEDED FOR WHEEZING OR SHORTNESS OF BREATH   verapamil 180 MG CR tablet Commonly known as:  CALAN-SR Take 1 tablet (180 mg total) by mouth daily.   zolpidem 12.5 MG CR tablet Commonly known as:  AMBIEN CR       Past Medical History:  Diagnosis Date  . Anxiety   . Asthma    h/o IgG deficiency, bronchiectasis, sees Dr.Kuzlow, previously saw Dr.Wert  . Cataract   . Chronic cough   . Depression   . Diabetes mellitus   . GERD (gastroesophageal reflux disease)   . Goiter    multinodular  . Hyperlipidemia   . Hypertension   . Increased blood pressure (not  hypertension)    w/ cardiolite  . Menopause    G0 P0  . Sciatica of right side   . Wheezing     Past Surgical History:  Procedure Laterality Date  . BRONCHOSCOPY     h/o  . CATARACT EXTRACTION     R 2012   . COLONOSCOPY    . Eye Lid surgery     cosemtic  . KNEE SURGERY    . PARTIAL HYSTERECTOMY    . POLYPECTOMY    . thryoid ultrasound  02/24/2006  . TONSILLECTOMY      Review of systems negative except as noted in HPI / PMHx or noted below:  Review of Systems  Constitutional: Negative.   HENT: Negative.   Eyes: Negative.   Respiratory: Negative.   Cardiovascular: Negative.   Gastrointestinal: Negative.   Genitourinary: Negative.   Musculoskeletal: Negative.   Skin: Negative.   Neurological: Negative.   Endo/Heme/Allergies: Negative.   Psychiatric/Behavioral: Negative.      Objective:   Vitals:   01/22/18 1208  BP: 118/64  Pulse: 80  Resp: 18          Physical Exam  HENT:  Head: Normocephalic.  Right Ear: Tympanic membrane, external ear and ear canal normal.  Left Ear: Tympanic membrane, external ear and ear canal normal.  Nose: Nose normal. No mucosal edema or rhinorrhea.  Mouth/Throat: Uvula is midline, oropharynx is clear and moist and mucous membranes are normal. No oropharyngeal exudate.  Eyes: Conjunctivae are normal.  Neck: Trachea normal. No tracheal tenderness present. No tracheal deviation present. No thyromegaly present.  Cardiovascular: Normal rate, regular rhythm, S1 normal, S2 normal and normal heart sounds.  No murmur heard. Pulmonary/Chest: Breath sounds normal. No stridor. No respiratory distress. She has no wheezes (Scattered expiratory wheezes all lung fields). She has no rales.  Musculoskeletal: She exhibits no edema.  Lymphadenopathy:       Head (right side): No tonsillar adenopathy present.       Head (left side): No tonsillar adenopathy present.    She has no cervical adenopathy.  Neurological: She is alert.  Skin: No rash  noted. She is not diaphoretic. No erythema. Nails show no clubbing.    Diagnostics:    Spirometry was performed and demonstrated an FEV1 of 2.14 at 94 % of predicted.  The patient had an Asthma Control Test with the following results: ACT Total Score: 13.    Also blood tests obtained 30 April 2017 identified WBC 5.9, absolute eosinophil 100, absolute lymphocyte 1800, hemoglobin 13.7, platelet 240, IgG 688 mg/DL, IgM 26 mg/DL, IgA 228 mg/DL  Assessment and Plan:   1. Asthma, severe persistent, well-controlled   2. Other allergic rhinitis   3. LPRD (laryngopharyngeal reflux disease)   4. Chronic narcotic use     1.  Continue Dulera 200 - 2 inhalations twice a day  2. Continue Singulair 10 mg daily  3. Continue Flonase one-2 sprays each nostril once a day  4. Increase omeprazole 40 mg twice a day  5. Continue ProAir HFA or DuoNeb nebulization if needed (new nebulizer)  6. May use Tussionex 2.5-5.0 mL's twice a day if needed  7. Continue "action plan" for asthma flare including use of Qvar 80 REDIHALER 2 inhalations twice a day added to [facility]  8. Return to clinic in 6 months or earlier if problem  Tayana appears to be stable with stability defined by chronic asthmatic symptoms but fortunately no requirement for systemic steroid during the past 10 months.  We will continue her on the treatment noted above which addresses inflammation and because she still has the sudden onset coughing spells and some classic reflux symptoms we will increase her omeprazole to 40 mg twice a day.  I will see her back in this clinic in 6 months or earlier if there is a problem.  Allena Katz, MD Allergy / Immunology [place]

## 2018-01-26 ENCOUNTER — Encounter: Payer: Self-pay | Admitting: Allergy and Immunology

## 2018-02-02 ENCOUNTER — Telehealth: Payer: Self-pay | Admitting: Allergy and Immunology

## 2018-02-02 NOTE — Telephone Encounter (Signed)
Ad[facility] to [facility] with them if they do not have Rx we can send to somewhere local

## 2018-02-02 NOTE — Telephone Encounter (Signed)
Patient was recently seen Given a nebulizer Wants to know if medication for the nebulizer had been sent in?? Patient has not received medication, or gotten a call regarding it Please call

## 2018-02-06 ENCOUNTER — Other Ambulatory Visit: Payer: Self-pay | Admitting: *Deleted

## 2018-02-06 MED ORDER — IPRATROPIUM-ALBUTEROL 0.5-2.5 (3) MG/3ML IN SOLN: RESPIRATORY_TRACT | 1 refills | Status: DC

## 2018-02-09 ENCOUNTER — Other Ambulatory Visit: Payer: Self-pay | Admitting: *Deleted

## 2018-02-09 MED ORDER — IPRATROPIUM-ALBUTEROL 0.5-2.5 (3) MG/3ML IN SOLN: RESPIRATORY_TRACT | 1 refills | Status: DC

## 2018-02-27 ENCOUNTER — Telehealth: Payer: Self-pay | Admitting: Allergy and Immunology

## 2018-02-27 MED ORDER — AMOXICILLIN-POT CLAVULANATE 875-125 MG PO TABS: 1.0000 | ORAL_TABLET | Freq: Two times a day (BID) | ORAL | 0 refills | Status: AC

## 2018-02-27 MED ORDER — GUAIFENESIN ER 600 MG PO TB12: 600.0000 mg | ORAL_TABLET | Freq: Two times a day (BID) | ORAL | 1 refills | Status: AC

## 2018-02-27 MED ORDER — PREDNISONE 10 MG PO TABS: ORAL_TABLET | ORAL | 0 refills | Status: DC

## 2018-02-27 NOTE — Telephone Encounter (Signed)
Called patient to find out more info. Se advised that symptoms started 2 days ago with nasal congestion.  She advised that she has green drainage and thinks she has sinus infection.  She does not have any other symptoms and reports she is using her fluticasone

## 2018-02-27 NOTE — Telephone Encounter (Signed)
[name] Rogers reports she has had sinus pressure, thick nasal drainage, and congestion for 2 weeks. She reports the nasal drainage has turned green over the last 5 days and she began to run a low grade fever 5 days ago. She reports an increase in wheezing over the last 4-5 days. She is currently using Dulera 2 puffs twice a day, Qvar 2 puffs twice a day, albuterol several times a day, douneb via nebulizer 4-5 times last week without any significant relief. I will call in prednisone and an antibiotic. She agrees with this plan and understands to call the clinic with worsening symptoms or a higher fever. She understands to call 911 for an emergency. She will come in for an office visit if not feeling better by Monday

## 2018-02-27 NOTE — Addendum Note (Signed)
Addended by: Dara Hoyer on: 02/27/2018 02:00 PM   Modules accepted: Orders

## 2018-02-27 NOTE — Telephone Encounter (Signed)
[name] Rogers called in and states she has a sinus infection.  [name] Rogers states she can't breathe and she is stopped up.  [name] Rogers would like something called in to [facility] in [place].

## 2018-03-25 ENCOUNTER — Other Ambulatory Visit: Payer: Self-pay | Admitting: *Deleted

## 2018-03-25 ENCOUNTER — Telehealth: Payer: Self-pay

## 2018-03-25 MED ORDER — HYDROCOD POLST-CPM POLST ER 10-8 MG/5ML PO SUER: ORAL | 0 refills | Status: DC

## 2018-03-25 NOTE — Telephone Encounter (Signed)
done

## 2018-03-25 NOTE — Telephone Encounter (Signed)
Please refill.

## 2018-03-25 NOTE — Telephone Encounter (Signed)
Patient called and requested her tussionex refill standing order per Dr Neldon Mc.

## 2018-03-25 NOTE — Telephone Encounter (Signed)
Patient called requesting a refill on her Tussionex cough medicine.  Please advise.

## 2018-06-22 ENCOUNTER — Telehealth: Payer: Self-pay | Admitting: Allergy and Immunology

## 2018-06-22 MED ORDER — HYDROCOD POLST-CPM POLST ER 10-8 MG/5ML PO SUER: ORAL | 0 refills | Status: DC

## 2018-06-22 NOTE — Telephone Encounter (Signed)
Please provide prescription 

## 2018-06-22 NOTE — Telephone Encounter (Signed)
[name] Rogers called in and would like to pick up her monthly Tussionex prescription.

## 2018-06-22 NOTE — Telephone Encounter (Signed)
rx printed and put up front

## 2018-08-28 ENCOUNTER — Telehealth: Payer: Self-pay | Admitting: *Deleted

## 2018-08-28 NOTE — Telephone Encounter (Signed)
Kindel called to request her monthly Tussinonex RX. I told her that she is over due for her 6 month appt. If you are okay with it, we will fill once and she will make an appt. Please advise. She will pick up next week if okay to fill.

## 2018-08-31 ENCOUNTER — Other Ambulatory Visit: Payer: Self-pay | Admitting: *Deleted

## 2018-08-31 MED ORDER — HYDROCOD POLST-CPM POLST ER 10-8 MG/5ML PO SUER: ORAL | 0 refills | Status: DC

## 2018-08-31 NOTE — Telephone Encounter (Signed)
RX printed, will leave up front and Taunia will make appt when she picks up.

## 2018-08-31 NOTE — Telephone Encounter (Signed)
That sounds like a good plan.

## 2018-11-16 ENCOUNTER — Encounter: Payer: Self-pay | Admitting: Allergy and Immunology

## 2018-11-16 ENCOUNTER — Ambulatory Visit (INDEPENDENT_AMBULATORY_CARE_PROVIDER_SITE_OTHER): Payer: Medicare Other | Admitting: Allergy and Immunology

## 2018-11-16 VITALS — BP 150/70 | HR 76 | Temp 98.9°F | Resp 16

## 2018-11-16 DIAGNOSIS — J4551 Severe persistent asthma with (acute) exacerbation: Secondary | ICD-10-CM | POA: Diagnosis not present

## 2018-11-16 DIAGNOSIS — F1190 Opioid use, unspecified, uncomplicated: Secondary | ICD-10-CM

## 2018-11-16 DIAGNOSIS — K219 Gastro-esophageal reflux disease without esophagitis: Secondary | ICD-10-CM | POA: Diagnosis not present

## 2018-11-16 DIAGNOSIS — J3089 Other allergic rhinitis: Secondary | ICD-10-CM | POA: Diagnosis not present

## 2018-11-16 MED ORDER — HYDROCOD POLST-CPM POLST ER 10-8 MG/5ML PO SUER: ORAL | 0 refills | Status: DC

## 2018-11-16 MED ORDER — ALBUTEROL SULFATE HFA 108 (90 BASE) MCG/ACT IN AERS: INHALATION_SPRAY | RESPIRATORY_TRACT | 0 refills | Status: AC

## 2018-11-16 MED ORDER — BECLOMETHASONE DIPROP HFA 80 MCG/ACT IN AERB: INHALATION_SPRAY | RESPIRATORY_TRACT | 0 refills | Status: DC

## 2018-11-16 MED ORDER — DULERA 200-5 MCG/ACT IN AERO: INHALATION_SPRAY | RESPIRATORY_TRACT | 1 refills | Status: DC

## 2018-11-16 NOTE — Patient Instructions (Addendum)
  1. Continue Dulera 200 - 2 inhalations twice a day  2. Continue Singulair 10 mg daily  3. Continue Flonase one-2 sprays each nostril once a day  4.  Continue omeprazole 40 mg twice a day  5. Continue ProAir HFA or DuoNeb nebulization if needed (new nebulizer)  6. May use Tussionex 2.5-5.0 mL's twice a day if needed  7. Continue action plan for asthma flare including use of Qvar 80 REDIHALER 2 inhalations twice a day added to [facility]  8.  Prednisone 10 mg -2 tablets once a day for 10 days only  9. Return to clinic in 6 months or earlier if problem

## 2018-11-16 NOTE — Progress Notes (Signed)
Follow-up Note  Referring Provider: Jimmie Molly, MD Primary Provider: Jimmie Molly, MD Date of Office Visit: 11/16/2018  Subjective:   [name] Rogers (DOB: Jul 31, 1946) is a 73 y.o. female who returns to the [facility] on 11/16/2018 in re-evaluation of the following:  HPI: [name] Rogers returns to this clinic in evaluation of her asthma and allergic rhinoconjunctivitis and LPR and chronic nighttime narcotic use for cough suppression.  I have not seen her in this clinic since 22 Jan 2018.  During this interval of time she has not required a systemic steroid or an antibiotic to treat any type of respiratory tract issue.  Her requirement for bronchodilator is usually 1 time per day.  She can walk without much difficulty.  She does not have disturbance of her sleep while utilizing Tussionex at nighttime as well as her anti-inflammatory agents for her airway condition.  However, over the course of the past week she has developed significant nasal congestion and sneezing and wheezing and coughing and she has been using her bronchodilator multiple times per day.  She has not had any fever or ugly nasal discharge or sputum production or chest pain or any constitutional symptoms.  She believes that her reflux is under very good control as long as she continues to use her proton pump inhibitor twice a day.  She did obtain the flu vaccine this year.  Allergies as of 11/16/2018   No Known Allergies     Medication List      atorvastatin 80 MG tablet Commonly known as:  LIPITOR TAKE 1 TABLET DAILY   beclomethasone 80 MCG/ACT inhaler Commonly known as:  QVAR REDIHALER Inhale two doses twice daily during asthma flare-up.  Rinse, gargle, and spit after use.   chlorpheniramine-HYDROcodone 10-8 MG/5ML Suer Commonly known as:  TUSSIONEX PENNKINETIC ER Can take 2.5 to 5 ml twice daily if needed for coughing.   citalopram 20 MG tablet Commonly known as:  CELEXA TAKE 1 TABLET  TWICE A DAY   COLLAGEN PO Take by mouth.   DULERA 200-5 MCG/ACT Aero Generic drug:  mometasone-formoterol Inhale two puffs twice daily to prevent cough or wheeze.  Rinse, gargle, and spit after use.   fluticasone 50 MCG/ACT nasal spray Commonly known as:  FLONASE USE ONE SPRAY INTO BOTH NOSTRILS TWICE A DAY   guaiFENesin 600 MG 12 hr tablet Commonly known as:  MUCINEX Take 1 tablet (600 mg total) by mouth 2 (two) times daily.   ipratropium-albuterol 0.5-2.5 (3) MG/3ML Soln Commonly known as:  DUONEB Inhale the contents of one vial in nebulizer every four to six hours as needed for cough or wheeze.   montelukast 10 MG tablet Commonly known as:  SINGULAIR Take 1 tablet (10 mg total) by mouth at bedtime.   OMEGA-3-6-9 PO Take by mouth 2 (two) times daily.   omeprazole 40 MG capsule Commonly known as:  PRILOSEC Take one capsule twice daily as directed.   PROAIR HFA 108 (90 Base) MCG/ACT inhaler Generic drug:  albuterol USE 2 INHALATIONS EVERY 4 HOURS AS NEEDED FOR WHEEZING OR SHORTNESS OF BREATH   verapamil 180 MG CR tablet Commonly known as:  CALAN-SR Take 1 tablet (180 mg total) by mouth daily.   VITAMIN D PO Take by mouth 2 (two) times daily.   zolpidem 12.5 MG CR tablet Commonly known as:  AMBIEN CR       Past Medical History:  Diagnosis Date  . Anxiety   . Asthma  h/o IgG deficiency, bronchiectasis, sees Dr., previously saw Dr.Wert  . Cataract   . Chronic cough   . Depression   . Diabetes mellitus   . GERD (gastroesophageal reflux disease)   . Goiter    multinodular  . Hyperlipidemia   . Hypertension   . Increased blood pressure (not hypertension)    w/ cardiolite  . Menopause    G0 P0  . Sciatica of right side   . Wheezing     Past Surgical History:  Procedure Laterality Date  . BRONCHOSCOPY     h/o  . CATARACT EXTRACTION     R 2012   . COLONOSCOPY    . Eye Lid surgery     cosemtic  . KNEE SURGERY    . PARTIAL HYSTERECTOMY      . POLYPECTOMY    . thryoid ultrasound  02/24/2006  . TONSILLECTOMY      Review of systems negative except as noted in HPI / PMHx or noted below:  Review of Systems  Constitutional: Negative.   HENT: Negative.   Eyes: Negative.   Respiratory: Negative.   Cardiovascular: Negative.   Gastrointestinal: Negative.   Genitourinary: Negative.   Musculoskeletal: Negative.   Skin: Negative.   Neurological: Negative.   Endo/Heme/Allergies: Negative.   Psychiatric/Behavioral: Negative.      Objective:   Vitals:   11/16/18 1513  BP: (!) 150/70  Pulse: 76  Resp: 16  Temp: 98.9 F (37.2 C)  SpO2: 92%          Physical Exam Constitutional:      Appearance: She is not diaphoretic.  HENT:     Head: Normocephalic.     Right Ear: Tympanic membrane, ear canal and external ear normal.     Left Ear: Tympanic membrane, ear canal and external ear normal.     Nose: Nose normal. No mucosal edema or rhinorrhea.     Mouth/Throat:     Pharynx: Uvula midline. No oropharyngeal exudate.  Eyes:     Conjunctiva/sclera: Conjunctivae normal.  Neck:     Thyroid: No thyromegaly.     Trachea: Trachea normal. No tracheal tenderness or tracheal deviation.  Cardiovascular:     Rate and Rhythm: Normal rate and regular rhythm.     Heart sounds: Normal heart sounds, S1 normal and S2 normal. No murmur.  Pulmonary:     Effort: No respiratory distress.     Breath sounds: No stridor. Wheezing (Expiratory wheezes posterior lung fields bilaterally) present. No rales.  Lymphadenopathy:     Head:     Right side of head: No tonsillar adenopathy.     Left side of head: No tonsillar adenopathy.     Cervical: No cervical adenopathy.  Skin:    Findings: No erythema or rash.     Nails: There is no clubbing.   Neurological:     Mental Status: She is alert.     Diagnostics:    Spirometry was performed and demonstrated an FEV1 of 2.01 at 90 % of predicted.  The patient had an Asthma Control Test with  the following results: ACT Total Score: 15.    Assessment and Plan:   1. Asthma, not well controlled, severe persistent, with acute exacerbation   2. Other allergic rhinitis   3. LPRD (laryngopharyngeal reflux disease)   4. Chronic narcotic use     1. Continue Dulera 200 - 2 inhalations twice a day  2. Continue Singulair 10 mg daily  3. Continue Flonase one-2 sprays each  nostril once a day  4.  Continue omeprazole 40 mg twice a day  5. Continue ProAir HFA or DuoNeb nebulization if needed (new nebulizer)  6. May use Tussionex 2.5-5.0 mL's twice a day if needed  7. Continue action plan for asthma flare including use of Qvar 80 REDIHALER 2 inhalations twice a day added to [facility]  8.  Prednisone 10 mg -2 tablets once a day for 10 days only  9. Return to clinic in 6 months or earlier if problem  [name] Rogers appears to have developed a viral respiratory tract infection that is giving rise to significant inflammation of her airway and she will utilize a course of prednisone as noted above as well as continue on her anti-inflammatory agents for airway.  Her reflux appears to be pretty good on her proton pump inhibitor twice a day.  She continues to use Tussionex mostly at nighttime.  I have had a talk with her in the past about attempting to treat her chronic inflammatory condition with other forms of therapy other than her current plan of using a narcotic-based cough suppressant every night but she is not interested at all in any additional therapy at this point.  She is very satisfied with the response that she has received from therapy regarding her chronic respiratory tract symptomatology.  If she does well I will see her back in this clinic in 6 months or earlier if there is a problem.  Allena Katz, MD Allergy / Immunology [place]

## 2018-11-17 ENCOUNTER — Encounter: Payer: Self-pay | Admitting: Allergy and Immunology

## 2019-01-11 ENCOUNTER — Other Ambulatory Visit: Payer: Self-pay | Admitting: *Deleted

## 2019-01-11 ENCOUNTER — Telehealth: Payer: Self-pay | Admitting: Allergy and Immunology

## 2019-01-11 MED ORDER — HYDROCOD POLST-CPM POLST ER 10-8 MG/5ML PO SUER: ORAL | 0 refills | Status: DC

## 2019-01-11 MED ORDER — FLUTICASONE-UMECLIDIN-VILANT 100-62.5-25 MCG/INH IN AEPB: 1.0000 | INHALATION_SPRAY | Freq: Every day | RESPIRATORY_TRACT | 1 refills | Status: DC

## 2019-01-11 NOTE — Telephone Encounter (Signed)
error 

## 2019-01-11 NOTE — Telephone Encounter (Signed)
[name] Rogers called in and states she would like a prescription for Tussionex.  [name] Rogers states [facility] told her that [name] stopped making patients having to pick up their prescriptions and that we could send an

## 2019-01-11 NOTE — Telephone Encounter (Signed)
[name] Rogers called in and states she wold like a prescription for Tussionex.  [name] Rogers states [facility] told her that [place] stopped making patients having to pick up their prescriptions and that we could send an E-Script in to [employer].  [name] Rogers states she would like that done so she doesn't have to drive all this way to pick up the prescription.  Also, [name] Rogers had received a sample of Trelegy.  [name] Rogers states she loves it and it works for her and since using it she has not had to use any other inhaler.  [name] Rogers would like to know if she could continue to use Trelegy and stop all other inhalers.  Please advise.

## 2019-01-11 NOTE — Telephone Encounter (Signed)
Please inform [name] Rogers that we can send in Tussionex and trelegy inhaler.

## 2019-01-11 NOTE — Telephone Encounter (Signed)
Spoke with [name] Rogers and let her know that our system will not allow [place] to electronically send narcotic rx. I sent the Trelegy. She will pick up the printed rx for Tussionex.

## 2019-04-13 ENCOUNTER — Telehealth: Payer: Self-pay | Admitting: Allergy and Immunology

## 2019-04-13 NOTE — Telephone Encounter (Signed)
[name] Rogers called in and would like a prescription for Tussionex.

## 2019-04-13 NOTE — Telephone Encounter (Signed)
Please renew her Tussionex prescription as per previous refills.

## 2019-04-14 ENCOUNTER — Other Ambulatory Visit: Payer: Self-pay | Admitting: *Deleted

## 2019-04-14 MED ORDER — HYDROCOD POLST-CPM POLST ER 10-8 MG/5ML PO SUER: ORAL | 0 refills | Status: DC

## 2019-04-14 NOTE — Telephone Encounter (Signed)
RX printed and signed. Up front for her to pick up.

## 2019-06-16 ENCOUNTER — Telehealth: Payer: Self-pay | Admitting: Allergy and Immunology

## 2019-06-16 ENCOUNTER — Other Ambulatory Visit: Payer: Self-pay | Admitting: *Deleted

## 2019-06-16 MED ORDER — HYDROCOD POLST-CPM POLST ER 10-8 MG/5ML PO SUER: ORAL | 0 refills | Status: DC

## 2019-06-16 NOTE — Telephone Encounter (Signed)
[name] Rogers called in and requested a prescription for Tussionex.

## 2019-06-16 NOTE — Telephone Encounter (Signed)
Please renew and inform patient.

## 2019-06-16 NOTE — Telephone Encounter (Signed)
RX up front for p/u.

## 2019-08-24 ENCOUNTER — Other Ambulatory Visit: Payer: Self-pay | Admitting: *Deleted

## 2019-08-24 ENCOUNTER — Telehealth: Payer: Self-pay | Admitting: *Deleted

## 2019-08-24 NOTE — Telephone Encounter (Signed)
If it is time for a refill without any evidence of overuse then we can refill Tussionex.

## 2019-08-24 NOTE — Telephone Encounter (Signed)
Last request was on 06/16/2019. Her last OV was in Feb. 2020, do you want her to make a f/u appt?

## 2019-08-24 NOTE — Telephone Encounter (Signed)
Requesting Tussionex rx.

## 2019-08-24 NOTE — Telephone Encounter (Signed)
Needs OV within one year

## 2019-08-25 ENCOUNTER — Other Ambulatory Visit: Payer: Self-pay | Admitting: Allergy and Immunology

## 2019-08-25 MED ORDER — HYDROCOD POLST-CPM POLST ER 10-8 MG/5ML PO SUER: ORAL | 0 refills | Status: DC

## 2019-08-25 NOTE — Telephone Encounter (Signed)
Rx up front for pt to pick up. We will schedule an appt when she comes in.

## 2019-10-13 ENCOUNTER — Ambulatory Visit: Payer: Medicare Other | Admitting: Allergy and Immunology

## 2019-10-14 ENCOUNTER — Ambulatory Visit: Payer: Medicare HMO | Admitting: Allergy and Immunology

## 2019-10-25 ENCOUNTER — Ambulatory Visit: Payer: Medicare HMO | Admitting: Allergy and Immunology

## 2019-10-27 ENCOUNTER — Encounter: Payer: Self-pay | Admitting: Allergy and Immunology

## 2019-10-27 ENCOUNTER — Other Ambulatory Visit: Payer: Self-pay

## 2019-10-27 ENCOUNTER — Ambulatory Visit (INDEPENDENT_AMBULATORY_CARE_PROVIDER_SITE_OTHER): Payer: Medicare HMO | Admitting: Allergy and Immunology

## 2019-10-27 VITALS — BP 120/68 | HR 78 | Temp 97.2°F | Resp 16 | Ht 65.0 in | Wt 189.4 lb

## 2019-10-27 DIAGNOSIS — J4550 Severe persistent asthma, uncomplicated: Secondary | ICD-10-CM

## 2019-10-27 DIAGNOSIS — F1190 Opioid use, unspecified, uncomplicated: Secondary | ICD-10-CM

## 2019-10-27 DIAGNOSIS — J479 Bronchiectasis, uncomplicated: Secondary | ICD-10-CM

## 2019-10-27 MED ORDER — HYDROCOD POLST-CPM POLST ER 10-8 MG/5ML PO SUER: ORAL | 0 refills | Status: DC

## 2019-10-27 MED ORDER — TRELEGY ELLIPTA 200-62.5-25 MCG/INH IN AEPB: 1.0000 | INHALATION_SPRAY | Freq: Every day | RESPIRATORY_TRACT | 1 refills | Status: DC

## 2019-10-27 NOTE — Patient Instructions (Addendum)
  1. Continue Trelegy 200 - 1 inhalation 1 time per day  2. Continue Singulair 10 mg daily  3. Continue Flonase one-2 sprays each nostril once a day  4.  Continue omeprazole 40 mg twice a day  5. Continue ProAir HFA or DuoNeb nebulization if needed    6. May use Tussionex 2.5-5.0 mL's twice a day if needed  7. Continue action plan for asthma flare including use of Qvar 80 REDIHALER 2 inhalations twice a day added Trelegy  8. Return to clinic in 6 months or earlier if problem  9. Consider evaluation with gastroenterologist: Gallbladder problem?

## 2019-10-27 NOTE — Progress Notes (Signed)
[place]   Follow-up Note  Referring Provider: Jimmie Molly, MD Primary Provider: Jimmie Molly, MD Date of Office Visit: 10/27/2019  Subjective:   [name] Rogers (DOB: [birth year]) is a 74 y.o. female who returns to the [facility] [place] on 10/27/2019 in re-evaluation of the following:  HPI: [name] Rogers returns to this clinic in evaluation of asthma and bronchiectasis and allergic rhinoconjunctivitis and LPR and chronic nighttime narcotic based cough medicine use for cough suppression.  I last saw her in this clinic on 16 November 2018.  She saw a pulmonologist in [place] this winter and had bronchoscopy performed and apparently her bronchial lavage for her bronchiectasis and was treated with systemic steroids and this appeared to help her chronic cough.  Prior to that point in time she states that she did require systemic steroid and antibiotic around December or so for another episode of coughing but otherwise has not required any additional systemic steroid or antibiotic for her chronic cough for which he uses a narcotic-based cough medication every night and occasionally sometimes in the day usually utilizing 2.5 mL of Tussionex per dose.   We started her on Trelegy in April 2020 and this has helped her cough as well.  She still must use a short acting bronchodilator on occasion.  She believes that her reflux is under pretty good control at this point in time while using a proton pump inhibitor twice a day.  However, she does relate a history of developing a combination of diarrhea and vomiting about every 2 or 3 months that usually last for a few hours with no other associated systemic or constitutional symptoms and no obvious trigger giving rise to that issue.  This happens midmorning before she eats any food.  She does not consume any mammal within 12 hours prior to these events.  She has received the flu vaccine for this  year and has received the first Covid vaccine.  Allergies as of 10/27/2019   No Known Allergies     Medication List      albuterol 108 (90 Base) MCG/ACT inhaler Commonly known as: ProAir HFA Can use two puffs every four to six hours as needed for cough or wheeze.   atorvastatin 80 MG tablet Commonly known as: LIPITOR TAKE 1 TABLET DAILY   beclomethasone 80 MCG/ACT inhaler Commonly known as: Qvar RediHaler Inhale two doses twice daily during asthma flare-up.  Rinse, gargle, and spit after use.   chlorpheniramine-HYDROcodone 10-8 MG/5ML Suer Commonly known as: Tussionex Pennkinetic ER Can take 2.5 to 5 ml twice daily if needed for coughing.   citalopram 20 MG tablet Commonly known as: CELEXA TAKE 1 TABLET TWICE A DAY   COLLAGEN PO Take by mouth.   fluticasone 50 MCG/ACT nasal spray Commonly known as: FLONASE USE ONE SPRAY INTO BOTH NOSTRILS TWICE A DAY   Fluticasone-Umeclidin-Vilant 100-62.5-25 MCG/INH Aepb Commonly known as: Trelegy Ellipta Inhale 1 puff into the lungs daily.   guaiFENesin 600 MG 12 hr tablet Commonly known as: MUCINEX Take 1 tablet (600 mg total) by mouth 2 (two) times daily.   ipratropium-albuterol 0.5-2.5 (3) MG/3ML Soln Commonly known as: DUONEB Inhale the contents of one vial in nebulizer every four to six hours as needed for cough or wheeze.   montelukast 10 MG tablet Commonly known as: SINGULAIR Take 1 tablet (10 mg total) by mouth at bedtime.   OMEGA-3-6-9 PO Take by mouth 2 (two) times daily.  omeprazole 40 MG capsule Commonly known as: PRILOSEC Take one capsule twice daily as directed.   verapamil 180 MG CR tablet Commonly known as: CALAN-SR Take 1 tablet (180 mg total) by mouth daily.   VITAMIN D PO Take by mouth 2 (two) times daily.   zolpidem 12.5 MG CR tablet Commonly known as: AMBIEN CR       Past Medical History:  Diagnosis Date  . Anxiety   . Asthma    h/o IgG deficiency, bronchiectasis, sees Dr.Callen Vancuren,  previously saw Dr.Wert  . Cataract   . Chronic cough   . Depression   . Diabetes mellitus   . GERD (gastroesophageal reflux disease)   . Goiter    multinodular  . Hyperlipidemia   . Hypertension   . Increased blood pressure (not hypertension)    w/ cardiolite  . Menopause    G0 P0  . Sciatica of right side   . Wheezing     Past Surgical History:  Procedure Laterality Date  . BRONCHOSCOPY     h/o  . CATARACT EXTRACTION     R 2012   . COLONOSCOPY    . Eye Lid surgery     cosemtic  . KNEE SURGERY    . PARTIAL HYSTERECTOMY    . POLYPECTOMY    . thryoid ultrasound  02/24/2006  . TONSILLECTOMY      Review of systems negative except as noted in HPI / PMHx or noted below:  Review of Systems  Constitutional: Negative.   HENT: Negative.   Eyes: Negative.   Respiratory: Negative.   Cardiovascular: Negative.   Gastrointestinal: Negative.   Genitourinary: Negative.   Musculoskeletal: Negative.   Skin: Negative.   Neurological: Negative.   Endo/Heme/Allergies: Negative.   Psychiatric/Behavioral: Negative.      Objective:   Vitals:   10/27/19 1109  BP: 120/68  Pulse: 78  Resp: 16  Temp: (!) 97.2 F (36.2 C)  SpO2: 93%   Height: 5\' 5"  (165.1 cm)  Weight: 189 lb 6.4 oz (85.9 kg)   Physical Exam Constitutional:      Appearance: She is not diaphoretic.  HENT:     Head: Normocephalic.     Right Ear: Tympanic membrane, ear canal and external ear normal.     Left Ear: Tympanic membrane, ear canal and external ear normal.     Nose: Nose normal. No mucosal edema or rhinorrhea.     Mouth/Throat:     Pharynx: Uvula midline. No oropharyngeal exudate.  Eyes:     Conjunctiva/sclera: Conjunctivae normal.  Neck:     Thyroid: No thyromegaly.     Trachea: Trachea normal. No tracheal tenderness or tracheal deviation.  Cardiovascular:     Rate and Rhythm: Normal rate and regular rhythm.     Heart sounds: Normal heart sounds, S1 normal and S2 normal. No murmur.    Pulmonary:     Effort: No respiratory distress.     Breath sounds: No stridor. Wheezing (Scattered inspiratory and expiratory rhonchi and wheeze) present. No rales.  Lymphadenopathy:     Head:     Right side of head: No tonsillar adenopathy.     Left side of head: No tonsillar adenopathy.     Cervical: No cervical adenopathy.  Skin:    Findings: No erythema or rash.     Nails: There is no clubbing.  Neurological:     Mental Status: She is alert.     Diagnostics:    Spirometry was not performed as per  patient's wishes  Assessment and Plan:   1. Not well controlled severe persistent asthma   2. BRONCHIECTASIS   3. Bronchiectasis without complication ([place])   4. Chronic narcotic use     1. Continue Trelegy 200 - 1 inhalation 1 time per day  2. Continue Singulair 10 mg daily  3. Continue Flonase one-2 sprays each nostril once a day  4.  Continue omeprazole 40 mg twice a day  5. Continue ProAir HFA or DuoNeb nebulization if needed    6. May use Tussionex 2.5-5.0 mL's twice a day if needed  7. Continue action plan for asthma flare including use of Qvar 80 REDIHALER 2 inhalations twice a day added Trelegy  8. Return to clinic in 6 months or earlier if problem  9. Consider evaluation with gastroenterologist: Gallbladder problem?  Alexiz is stable regarding her respiratory tract disease with stability defined as chronic respiratory tract symptoms but fortunately rare exacerbations requiring medical intervention.  She will continue to utilize the collection of medications noted above to address inflammation and reflux.  She will continue to use Tussionex at the lowest possible dose required to control her cough.  I have asked her to follow-up with her primary care doctor regarding her recurrent episodes of emesis and diarrhea that appear to occur about every third month.  Obviously she has some form of gastrointestinal issue that is poorly defined at this point.  Allena Katz,  MD Allergy / Immunology [place]

## 2019-10-28 ENCOUNTER — Encounter: Payer: Self-pay | Admitting: Allergy and Immunology

## 2019-10-28 ENCOUNTER — Other Ambulatory Visit: Payer: Self-pay | Admitting: *Deleted

## 2019-10-28 MED ORDER — TRELEGY ELLIPTA 200-62.5-25 MCG/INH IN AEPB: 1.0000 | INHALATION_SPRAY | Freq: Every day | RESPIRATORY_TRACT | 1 refills | Status: DC

## 2019-11-10 ENCOUNTER — Other Ambulatory Visit: Payer: Self-pay | Admitting: *Deleted

## 2019-11-10 ENCOUNTER — Telehealth: Payer: Self-pay | Admitting: Allergy and Immunology

## 2019-11-10 MED ORDER — TRELEGY ELLIPTA 200-62.5-25 MCG/INH IN AEPB: 1.0000 | INHALATION_SPRAY | Freq: Every day | RESPIRATORY_TRACT | 1 refills | Status: DC

## 2019-11-10 NOTE — Telephone Encounter (Signed)
[name] Rogers called in and would like a prescription sent to [facility] for Trelegy.

## 2019-11-10 NOTE — Telephone Encounter (Signed)
RX sent to [facility].

## 2019-11-15 ENCOUNTER — Other Ambulatory Visit: Payer: Self-pay | Admitting: *Deleted

## 2019-11-15 MED ORDER — TRELEGY ELLIPTA 200-62.5-25 MCG/INH IN AEPB: 1.0000 | INHALATION_SPRAY | Freq: Every day | RESPIRATORY_TRACT | 0 refills | Status: DC

## 2019-12-07 ENCOUNTER — Other Ambulatory Visit: Payer: Self-pay | Admitting: *Deleted

## 2019-12-07 MED ORDER — TRELEGY ELLIPTA 200-62.5-25 MCG/INH IN AEPB: 1.0000 | INHALATION_SPRAY | Freq: Every day | RESPIRATORY_TRACT | 5 refills | Status: DC

## 2019-12-07 MED ORDER — IPRATROPIUM-ALBUTEROL 0.5-2.5 (3) MG/3ML IN SOLN: RESPIRATORY_TRACT | 1 refills | Status: DC

## 2019-12-13 ENCOUNTER — Other Ambulatory Visit: Payer: Self-pay | Admitting: *Deleted

## 2019-12-13 MED ORDER — IPRATROPIUM-ALBUTEROL 0.5-2.5 (3) MG/3ML IN SOLN: RESPIRATORY_TRACT | 1 refills | Status: DC

## 2019-12-29 ENCOUNTER — Telehealth: Payer: Self-pay | Admitting: Allergy and Immunology

## 2019-12-29 ENCOUNTER — Other Ambulatory Visit: Payer: Self-pay | Admitting: Allergy and Immunology

## 2019-12-29 MED ORDER — HYDROCOD POLST-CPM POLST ER 10-8 MG/5ML PO SUER: ORAL | 0 refills | Status: DC

## 2019-12-29 NOTE — Telephone Encounter (Signed)
Please have her prescription ready when she arrives at the clinic.

## 2019-12-29 NOTE — Telephone Encounter (Signed)
[name] Rogers called in and would like to pick up her prescription for Tussionex on Thursday 12/30/2019.  Please advise.

## 2019-12-29 NOTE — Telephone Encounter (Signed)
Rx ready for pick up. 

## 2020-01-03 ENCOUNTER — Other Ambulatory Visit: Payer: Self-pay | Admitting: *Deleted

## 2020-01-03 MED ORDER — TRELEGY ELLIPTA 200-62.5-25 MCG/INH IN AEPB: 1.0000 | INHALATION_SPRAY | Freq: Every day | RESPIRATORY_TRACT | 0 refills | Status: DC

## 2020-01-31 ENCOUNTER — Other Ambulatory Visit: Payer: Self-pay | Admitting: *Deleted

## 2020-01-31 MED ORDER — IPRATROPIUM-ALBUTEROL 0.5-2.5 (3) MG/3ML IN SOLN: RESPIRATORY_TRACT | 1 refills | Status: AC

## 2020-02-28 ENCOUNTER — Other Ambulatory Visit: Payer: Self-pay | Admitting: Allergy and Immunology

## 2020-02-28 ENCOUNTER — Telehealth: Payer: Self-pay | Admitting: Allergy and Immunology

## 2020-02-28 MED ORDER — HYDROCOD POLST-CPM POLST ER 10-8 MG/5ML PO SUER: ORAL | 0 refills | Status: DC

## 2020-02-28 NOTE — Telephone Encounter (Signed)
OK to refill

## 2020-02-28 NOTE — Telephone Encounter (Signed)
[name] Rogers called in and would like a prescription for Tussionex to pick up in office.

## 2020-02-29 NOTE — Telephone Encounter (Signed)
Called patient and informed her that RX is ready for her to pick up.

## 2020-03-08 ENCOUNTER — Other Ambulatory Visit: Payer: Self-pay | Admitting: *Deleted

## 2020-03-08 MED ORDER — FLUTICASONE PROPIONATE 50 MCG/ACT NA SUSP: NASAL | 0 refills | Status: DC

## 2020-04-07 ENCOUNTER — Telehealth: Payer: Self-pay | Admitting: Allergy and Immunology

## 2020-04-07 NOTE — Telephone Encounter (Signed)
[name] Rogers would like her prescription for Tussionex.

## 2020-04-10 ENCOUNTER — Other Ambulatory Visit: Payer: Self-pay | Admitting: Allergy and Immunology

## 2020-04-10 MED ORDER — HYDROCOD POLST-CPM POLST ER 10-8 MG/5ML PO SUER: ORAL | 0 refills | Status: DC

## 2020-04-10 NOTE — Telephone Encounter (Signed)
Please provide prescription

## 2020-04-10 NOTE — Telephone Encounter (Signed)
Rx printed and up front for pick up.

## 2020-04-26 ENCOUNTER — Ambulatory Visit: Payer: Medicare HMO | Admitting: Allergy and Immunology

## 2020-05-04 ENCOUNTER — Other Ambulatory Visit: Payer: Self-pay

## 2020-05-04 ENCOUNTER — Ambulatory Visit (INDEPENDENT_AMBULATORY_CARE_PROVIDER_SITE_OTHER): Payer: Medicare HMO | Admitting: Allergy and Immunology

## 2020-05-04 VITALS — BP 146/88 | HR 75 | Resp 18 | Ht 65.0 in | Wt 168.2 lb

## 2020-05-04 DIAGNOSIS — K219 Gastro-esophageal reflux disease without esophagitis: Secondary | ICD-10-CM

## 2020-05-04 DIAGNOSIS — F1190 Opioid use, unspecified, uncomplicated: Secondary | ICD-10-CM

## 2020-05-04 DIAGNOSIS — J4550 Severe persistent asthma, uncomplicated: Secondary | ICD-10-CM

## 2020-05-04 DIAGNOSIS — J479 Bronchiectasis, uncomplicated: Secondary | ICD-10-CM | POA: Diagnosis not present

## 2020-05-04 DIAGNOSIS — J3089 Other allergic rhinitis: Secondary | ICD-10-CM | POA: Diagnosis not present

## 2020-05-04 MED ORDER — HYDROCOD POLST-CPM POLST ER 10-8 MG/5ML PO SUER: ORAL | 0 refills | Status: DC

## 2020-05-04 NOTE — Progress Notes (Signed)
[facility]   Follow-up Note  Referring Provider: Jimmie Molly, MD Primary Provider: Jimmie Molly, MD  Date of Office Visit: 05/04/2020  Subjective:   [name] Rogers (DOB: 04-20-1946) is a 74 y.o. female who returns to the [facility] on 05/04/2020 in re-evaluation of the following:  HPI: Breckin returns to this clinic in evaluation of asthma and bronchiectasis and allergic rhinoconjunctivitis and LPR and chronic narcotic-based cough medicine use for cough suppression.  I last saw in this clinic on 27 October 2019.  Overall she has done relatively well.  She has only required prednisone 1 time through this interval for a "cold" induced coughing episode.  She did have to use her nebulizer a fair amount during that timeframe but otherwise intermittently uses her nebulizer maybe a few times a week.  She continues on a large collection of anti-inflammatory agents for her airway and also continues to use 2.5 mL of Tussionex 1-2 times per day.  Recently she did visit with her pulmonologist who felt that she was doing relatively well and he rescheduled an appointment for her in 1 year.  She had very little problems with her nose at this point.  She believes that her reflux is under pretty good control at this point using a proton pump inhibitor.  She has obtained two [facility] vaccinations.  Allergies as of 05/04/2020   No Known Allergies     Medication List    albuterol 108 (90 Base) MCG/ACT inhaler Commonly known as: ProAir HFA Can use two puffs every four to six hours as needed for cough or wheeze.   chlorpheniramine-HYDROcodone 10-8 MG/5ML Suer Commonly known as: Tussionex Pennkinetic ER Can take 2.5 to 5 ml twice daily if needed for coughing.   citalopram 20 MG tablet Commonly known as: CELEXA TAKE 1 TABLET TWICE A DAY   COLLAGEN PO Take by mouth.   fluticasone 50 MCG/ACT nasal spray Commonly known as:  FLONASE Use 1-2 sprays in each nostril once daily   guaiFENesin 600 MG 12 hr tablet Commonly known as: MUCINEX Take 1 tablet (600 mg total) by mouth 2 (two) times daily.   ipratropium-albuterol 0.5-2.5 (3) MG/3ML Soln Commonly known as: DUONEB Inhale the contents of one vial in nebulizer every four to six hours as needed for cough or wheeze.   montelukast 10 MG tablet Commonly known as: SINGULAIR Take 1 tablet (10 mg total) by mouth at bedtime.   OMEGA-3-6-9 PO Take by mouth 2 (two) times daily.   omeprazole 40 MG capsule Commonly known as: PRILOSEC Take one capsule twice daily as directed.   Trelegy Ellipta 200-62.5-25 MCG/INH Aepb Generic drug: Fluticasone-Umeclidin-Vilant Inhale 1 puff into the lungs daily.   verapamil 180 MG CR tablet Commonly known as: CALAN-SR Take 1 tablet (180 mg total) by mouth daily.   VITAMIN D PO Take by mouth 2 (two) times daily.   zolpidem 12.5 MG CR tablet Commonly known as: AMBIEN CR       Past Medical History:  Diagnosis Date  . Anxiety   . Asthma    h/o IgG deficiency, bronchiectasis, sees Dr.Yordy Matton, previously saw Dr.Wert  . Cataract   . Chronic cough   . Depression   . Diabetes mellitus   . GERD (gastroesophageal reflux disease)   . Goiter    multinodular  . Hyperlipidemia   . Hypertension   . Increased blood pressure (not hypertension)    w/ cardiolite  .  Menopause    G0 P0  . Sciatica of right side   . Wheezing     Past Surgical History:  Procedure Laterality Date  . BRONCHOSCOPY     h/o  . CATARACT EXTRACTION     R 2012   . COLONOSCOPY    . Eye Lid surgery     cosemtic  . KNEE SURGERY    . PARTIAL HYSTERECTOMY    . POLYPECTOMY    . thryoid ultrasound  02/24/2006  . TONSILLECTOMY      Review of systems negative except as noted in HPI / PMHx or noted below:  Review of Systems  Constitutional: Negative.   HENT: Negative.   Eyes: Negative.   Respiratory: Negative.   Cardiovascular: Negative.     Gastrointestinal: Negative.   Genitourinary: Negative.   Musculoskeletal: Negative.   Skin: Negative.   Neurological: Negative.   Endo/Heme/Allergies: Negative.   Psychiatric/Behavioral: Negative.      Objective:   Vitals:   05/04/20 1129  BP: (!) 146/88  Pulse: 75  Resp: 18  SpO2: 93%   Height: 5\' 5"  (165.1 cm)  Weight: 168 lb 3.2 oz (76.3 kg)   Physical Exam Constitutional:      Appearance: She is not diaphoretic.  HENT:     Head: Normocephalic.     Right Ear: Tympanic membrane, ear canal and external ear normal.     Left Ear: Tympanic membrane, ear canal and external ear normal.     Nose: Nose normal. No mucosal edema or rhinorrhea.     Mouth/Throat:     Pharynx: Uvula midline. No oropharyngeal exudate.  Eyes:     Conjunctiva/sclera: Conjunctivae normal.  Neck:     Thyroid: No thyromegaly.     Trachea: Trachea normal. No tracheal tenderness or tracheal deviation.  Cardiovascular:     Rate and Rhythm: Normal rate and regular rhythm.     Heart sounds: Normal heart sounds, S1 normal and S2 normal. No murmur heard.   Pulmonary:     Effort: No respiratory distress.     Breath sounds: No stridor. Wheezing (Scattered inspiratory and expiratory wheezes bilaterally) present. No rales.  Lymphadenopathy:     Head:     Right side of head: No tonsillar adenopathy.     Left side of head: No tonsillar adenopathy.     Cervical: No cervical adenopathy.  Skin:    Findings: No erythema or rash.     Nails: There is no clubbing.  Neurological:     Mental Status: She is alert.     Diagnostics: none  Assessment and Plan:   1. Not well controlled severe persistent asthma   2. Bronchiectasis without complication ([place])   3. Chronic narcotic use   4. Other allergic rhinitis   5. LPRD (laryngopharyngeal reflux disease)     1. Continue Trelegy 200 - 1 inhalation 1 time per day  2. Continue Singulair 10 mg daily  3. Continue Flonase one-2 sprays each nostril once a  day  4.  Continue omeprazole 40 mg twice a day  5. Continue ProAir HFA or DuoNeb nebulization if needed    6. May use Tussionex 2.5-5.0 mL's twice a day if needed  7. Continue action plan for asthma flare including use of Qvar 80 REDIHALER 2 inhalations twice a day added Trelegy  8. Return to clinic in 6 months or earlier if problem  [name] Rogers appears to be stable with stability defined by some chronic respiratory tract symptoms and a large  requirement for anti-inflammatory agents for her airway and use of chronic narcotic cough suppression.  I refilled her medications and we will see her back in his clinic in 6 months or earlier if there is a problem.  Allena Katz, MD [facility] / [facility]

## 2020-05-04 NOTE — Patient Instructions (Addendum)
°  1. Continue Trelegy 200 - 1 inhalation 1 time per day  2. Continue Singulair 10 mg daily  3. Continue Flonase one-2 sprays each nostril once a day  4.  Continue omeprazole 40 mg twice a day  5. Continue ProAir HFA or DuoNeb nebulization if needed    6. May use Tussionex 2.5-5.0 mL's twice a day if needed  7. Continue action plan for asthma flare including use of Qvar 80 REDIHALER 2 inhalations twice a day added Trelegy  8. Return to clinic in 6 months or earlier if problem

## 2020-05-08 ENCOUNTER — Encounter: Payer: Self-pay | Admitting: Allergy and Immunology

## 2020-05-23 ENCOUNTER — Other Ambulatory Visit: Payer: Self-pay | Admitting: Allergy and Immunology

## 2020-07-27 ENCOUNTER — Telehealth: Payer: Self-pay | Admitting: Allergy and Immunology

## 2020-07-27 ENCOUNTER — Other Ambulatory Visit: Payer: Self-pay | Admitting: Allergy and Immunology

## 2020-07-27 MED ORDER — HYDROCOD POLST-CPM POLST ER 10-8 MG/5ML PO SUER: ORAL | 0 refills | Status: DC

## 2020-07-27 NOTE — Telephone Encounter (Signed)
[name] Rogers called in and would like Tussionex filled.

## 2020-07-27 NOTE — Telephone Encounter (Signed)
Rx provided.

## 2020-07-27 NOTE — Telephone Encounter (Signed)
Please refill prescription for Tussionex

## 2020-08-22 ENCOUNTER — Other Ambulatory Visit: Payer: Self-pay | Admitting: *Deleted

## 2020-08-22 ENCOUNTER — Telehealth: Payer: Self-pay | Admitting: Allergy and Immunology

## 2020-08-22 MED ORDER — TRELEGY ELLIPTA 200-62.5-25 MCG/INH IN AEPB: 1.0000 | INHALATION_SPRAY | Freq: Every day | RESPIRATORY_TRACT | 0 refills | Status: AC

## 2020-08-22 NOTE — Telephone Encounter (Signed)
RX sent

## 2020-08-22 NOTE — Telephone Encounter (Signed)
Kemora would like a 90 day prescription of Trelegy sent to [facility].

## 2020-09-19 ENCOUNTER — Other Ambulatory Visit: Payer: Self-pay

## 2020-09-19 ENCOUNTER — Telehealth: Payer: Self-pay | Admitting: Allergy and Immunology

## 2020-09-19 NOTE — Telephone Encounter (Signed)
Please provide prescription 

## 2020-09-19 NOTE — Telephone Encounter (Signed)
[name] Rogers called in and would like to pick up a prescription for Tussionex.  Please advise.

## 2020-09-20 ENCOUNTER — Other Ambulatory Visit: Payer: Self-pay | Admitting: Allergy and Immunology

## 2020-09-20 MED ORDER — HYDROCOD POLST-CPM POLST ER 10-8 MG/5ML PO SUER: ORAL | 0 refills | Status: DC

## 2020-09-20 NOTE — Telephone Encounter (Signed)
Rx written and ready for pick up

## 2020-11-13 ENCOUNTER — Telehealth: Payer: Self-pay | Admitting: *Deleted

## 2020-11-13 MED ORDER — HYDROCOD POLST-CPM POLST ER 10-8 MG/5ML PO SUER: ORAL | 0 refills | Status: AC

## 2020-11-13 NOTE — Telephone Encounter (Signed)
Pt called wanting a refill of Tussionex. She is currently using Qvar + Trelegy + Albuterol. Please advise.

## 2020-11-13 NOTE — Telephone Encounter (Signed)
Please refill [name] Rogers's Tussionex prescription as we have been doing in the past. Also inform her that we could try a different form of therapy in an attempt to get her airway disease under better control if she was interested. It is called Tezspire.

## 2020-11-13 NOTE — Telephone Encounter (Signed)
Tussionex Rx is pending for you to sign off on- I do not have security clearance to print.

## 2020-11-14 NOTE — Telephone Encounter (Signed)
I spoke with [name] Rogers and let her know her prescription was ready for pick up and that Dr. Neldon Mc wanted to try a different form of therapy for her airway and told her about Tezpire.   Guilianna stated she didn't know if she wanted to do that or not.  Diasha states she wasn't sure she wanted to give up Tussionex but she would think on it and let us know.

## 2020-11-14 NOTE — Telephone Encounter (Signed)
Left message for patient to call the office.  Please let her know that prescription is ready to pick up.  Please advise her of Dr. Bruna Potter message as well ( see below).
